# Patient Record
Sex: Female | Born: 1980
Health system: Southern US, Community
[De-identification: ages and names within clinical notes are randomized; demographics above are authoritative.]

## PROBLEM LIST (undated history)

## (undated) DIAGNOSIS — G4730 Sleep apnea, unspecified: Secondary | ICD-10-CM

## (undated) DIAGNOSIS — E781 Pure hyperglyceridemia: Secondary | ICD-10-CM

## (undated) DIAGNOSIS — E669 Obesity, unspecified: Secondary | ICD-10-CM

## (undated) DIAGNOSIS — I10 Essential (primary) hypertension: Secondary | ICD-10-CM

## (undated) DIAGNOSIS — E119 Type 2 diabetes mellitus without complications: Secondary | ICD-10-CM

## (undated) DIAGNOSIS — B029 Zoster without complications: Secondary | ICD-10-CM

## (undated) DIAGNOSIS — D259 Leiomyoma of uterus, unspecified: Secondary | ICD-10-CM

## (undated) HISTORY — DX: Sleep apnea, unspecified: G47.30

## (undated) HISTORY — PX: MYOMECTOMY: SHX85

## (undated) HISTORY — DX: Zoster without complications: B02.9

---

## 2008-10-25 ENCOUNTER — Emergency Department (HOSPITAL_COMMUNITY): Admission: EM | Admit: 2008-10-25 | Discharge: 2008-10-25 | Payer: Self-pay | Admitting: Emergency Medicine

## 2010-02-11 ENCOUNTER — Emergency Department (HOSPITAL_COMMUNITY): Admission: EM | Admit: 2010-02-11 | Discharge: 2010-02-11 | Payer: Self-pay | Admitting: Emergency Medicine

## 2013-09-19 ENCOUNTER — Emergency Department (HOSPITAL_COMMUNITY)
Admission: EM | Admit: 2013-09-19 | Discharge: 2013-09-19 | Disposition: A | Discharge status: Home / Self Care | Payer: PRIVATE HEALTH INSURANCE | Attending: Emergency Medicine | Admitting: Emergency Medicine

## 2013-09-19 ENCOUNTER — Encounter (HOSPITAL_COMMUNITY): Payer: Self-pay | Admitting: Emergency Medicine

## 2013-09-19 ENCOUNTER — Emergency Department (HOSPITAL_COMMUNITY): Payer: PRIVATE HEALTH INSURANCE

## 2013-09-19 DIAGNOSIS — S46919A Strain of unspecified muscle, fascia and tendon at shoulder and upper arm level, unspecified arm, initial encounter: Secondary | ICD-10-CM

## 2013-09-19 DIAGNOSIS — Y92410 Unspecified street and highway as the place of occurrence of the external cause: Secondary | ICD-10-CM | POA: Insufficient documentation

## 2013-09-19 DIAGNOSIS — IMO0002 Reserved for concepts with insufficient information to code with codable children: Secondary | ICD-10-CM | POA: Insufficient documentation

## 2013-09-19 DIAGNOSIS — Y9389 Activity, other specified: Secondary | ICD-10-CM | POA: Insufficient documentation

## 2013-09-19 MED ORDER — DIAZEPAM 5 MG PO TABS: 5.0000 mg | ORAL_TABLET | Freq: Three times a day (TID) | ORAL | Status: DC | PRN

## 2013-09-19 MED ORDER — NAPROXEN 500 MG PO TABS: 500.0000 mg | ORAL_TABLET | Freq: Two times a day (BID) | ORAL | Status: DC

## 2013-09-19 MED ORDER — TRAMADOL HCL 50 MG PO TABS: 50.0000 mg | ORAL_TABLET | Freq: Four times a day (QID) | ORAL | Status: DC | PRN

## 2013-09-19 NOTE — Discharge Instructions (Signed)
Naprosyn for pain and inflammation. Ultram for severe pain. Valiume for spasms. Ice. Follow up with orthopedics specialist if not improving.   Shoulder Sprain A shoulder sprain is the result of damage to the tough, fiber-like tissues (ligaments) that help hold your shoulder in place. The ligaments may be stretched or torn. Besides the main shoulder joint (the ball and socket), there are several smaller joints that connect the bones in this area. A sprain usually involves one of those joints. Most often it is the acromioclavicular (or AC) joint. That is the joint that connects the collarbone (clavicle) and the shoulder blade (scapula) at the top point of the shoulder blade (acromion). A shoulder sprain is a mild form of what is called a shoulder separation. Recovering from a shoulder sprain may take some time. For some, pain lingers for several months. Most people recover without long term problems. CAUSES   A shoulder sprain is usually caused by some kind of trauma. This might be:  Falling on an outstretched arm.  Being hit hard on the shoulder.  Twisting the arm.  Shoulder sprains are more likely to occur in people who:  Play sports.  Have balance or coordination problems. SYMPTOMS   Pain when you move your shoulder.  Limited ability to move the shoulder.  Swelling and tenderness on top of the shoulder.  Redness or warmth in the shoulder.  Bruising.  A change in the shape of the shoulder. DIAGNOSIS  Your healthcare provider may:  Ask about your symptoms.  Ask about recent activity that might have caused those symptoms.  Examine your shoulder. You may be asked to do simple exercises to test movement. The other shoulder will be examined for comparison.  Order some tests that provide a look inside the body. They can show the extent of the injury. The tests could include:  X-rays.  CT (computed tomography) scan.  MRI (magnetic resonance imaging) scan. RISKS AND  COMPLICATIONS  Loss of full shoulder motion.  Ongoing shoulder pain. TREATMENT  How long it takes to recover from a shoulder sprain depends on how severe it was. Treatment options may include:  Rest. You should not use the arm or shoulder until it heals.  Ice. For 2 or 3 days after the injury, put an ice pack on the shoulder up to 4 times a day. It should stay on for 15 to 20 minutes each time. Wrap the ice in a towel so it does not touch your skin.  Over-the-counter medicine to relieve pain.  A sling or brace. This will keep the arm still while the shoulder is healing.  Physical therapy or rehabilitation exercises. These will help you regain strength and motion. Ask your healthcare provider when it is OK to begin these exercises.  Surgery. The need for surgery is rare with a sprained shoulder, but some people may need surgery to keep the joint in place and reduce pain. HOME CARE INSTRUCTIONS   Ask your healthcare provider about what you should and should not do while your shoulder heals.  Make sure you know how to apply ice to the correct area of your shoulder.  Talk with your healthcare provider about which medications should be used for pain and swelling.  If rehabilitation therapy will be needed, ask your healthcare provider to refer you to a therapist. If it is not recommended, then ask about at-home exercises. Find out when exercise should begin. SEEK MEDICAL CARE IF:  Your pain, swelling, or redness at the joint increases.  SEEK IMMEDIATE MEDICAL CARE IF:   You have a fever.  You cannot move your arm or shoulder. Document Released: 12/25/2008 Document Revised: 10/31/2011 Document Reviewed: 12/25/2008 [facility] Patient Information 2014 [place], [place].
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
## 2013-09-19 NOTE — ED Provider Notes (Signed)
Medical screening examination/treatment/procedure(s) were performed by non-physician practitioner and as supervising physician I was immediately available for consultation/collaboration.  EKG Interpretation   None         Neta Ehlers, MD 09/19/13 2206

## 2014-05-20 DIAGNOSIS — E119 Type 2 diabetes mellitus without complications: Secondary | ICD-10-CM | POA: Insufficient documentation

## 2017-02-08 DIAGNOSIS — R1011 Right upper quadrant pain: Secondary | ICD-10-CM | POA: Diagnosis not present

## 2017-02-08 DIAGNOSIS — K625 Hemorrhage of anus and rectum: Secondary | ICD-10-CM | POA: Diagnosis not present

## 2017-02-08 DIAGNOSIS — K640 First degree hemorrhoids: Secondary | ICD-10-CM | POA: Diagnosis not present

## 2017-02-09 ENCOUNTER — Other Ambulatory Visit: Payer: Self-pay | Admitting: Family Medicine

## 2017-02-09 DIAGNOSIS — R1011 Right upper quadrant pain: Secondary | ICD-10-CM

## 2017-03-15 ENCOUNTER — Ambulatory Visit
Admission: RE | Admit: 2017-03-15 | Discharge: 2017-03-15 | Disposition: A | Discharge status: Home / Self Care | Payer: Commercial Managed Care - PPO | Source: Ambulatory Visit | Attending: Family Medicine | Admitting: Family Medicine

## 2017-03-15 DIAGNOSIS — R1011 Right upper quadrant pain: Secondary | ICD-10-CM | POA: Diagnosis not present

## 2017-03-19 DIAGNOSIS — J028 Acute pharyngitis due to other specified organisms: Secondary | ICD-10-CM | POA: Diagnosis not present

## 2017-03-19 DIAGNOSIS — J029 Acute pharyngitis, unspecified: Secondary | ICD-10-CM | POA: Diagnosis not present

## 2017-04-25 DIAGNOSIS — R21 Rash and other nonspecific skin eruption: Secondary | ICD-10-CM | POA: Diagnosis not present

## 2017-04-25 DIAGNOSIS — L659 Nonscarring hair loss, unspecified: Secondary | ICD-10-CM | POA: Diagnosis not present

## 2017-04-25 DIAGNOSIS — R208 Other disturbances of skin sensation: Secondary | ICD-10-CM | POA: Diagnosis not present

## 2017-05-11 DIAGNOSIS — R0600 Dyspnea, unspecified: Secondary | ICD-10-CM | POA: Diagnosis not present

## 2017-05-11 DIAGNOSIS — R208 Other disturbances of skin sensation: Secondary | ICD-10-CM | POA: Diagnosis not present

## 2017-05-11 DIAGNOSIS — Z23 Encounter for immunization: Secondary | ICD-10-CM | POA: Diagnosis not present

## 2017-05-11 DIAGNOSIS — R21 Rash and other nonspecific skin eruption: Secondary | ICD-10-CM | POA: Diagnosis not present

## 2017-05-11 DIAGNOSIS — E119 Type 2 diabetes mellitus without complications: Secondary | ICD-10-CM | POA: Diagnosis not present

## 2017-05-11 DIAGNOSIS — L659 Nonscarring hair loss, unspecified: Secondary | ICD-10-CM | POA: Diagnosis not present

## 2017-05-12 ENCOUNTER — Other Ambulatory Visit: Payer: Self-pay | Admitting: Family Medicine

## 2017-05-12 DIAGNOSIS — R222 Localized swelling, mass and lump, trunk: Secondary | ICD-10-CM

## 2017-07-19 DIAGNOSIS — M940 Chondrocostal junction syndrome [Tietze]: Secondary | ICD-10-CM | POA: Diagnosis not present

## 2017-07-19 DIAGNOSIS — R1011 Right upper quadrant pain: Secondary | ICD-10-CM | POA: Diagnosis not present

## 2017-07-19 DIAGNOSIS — R1031 Right lower quadrant pain: Secondary | ICD-10-CM | POA: Diagnosis not present

## 2017-08-11 DIAGNOSIS — K760 Fatty (change of) liver, not elsewhere classified: Secondary | ICD-10-CM | POA: Diagnosis not present

## 2017-08-11 DIAGNOSIS — E559 Vitamin D deficiency, unspecified: Secondary | ICD-10-CM | POA: Diagnosis not present

## 2017-08-11 DIAGNOSIS — E119 Type 2 diabetes mellitus without complications: Secondary | ICD-10-CM | POA: Diagnosis not present

## 2017-08-11 DIAGNOSIS — R109 Unspecified abdominal pain: Secondary | ICD-10-CM | POA: Diagnosis not present

## 2017-08-18 ENCOUNTER — Ambulatory Visit
Admission: RE | Admit: 2017-08-18 | Discharge: 2017-08-18 | Disposition: A | Discharge status: Home / Self Care | Payer: Commercial Managed Care - PPO | Source: Ambulatory Visit | Attending: Family Medicine | Admitting: Family Medicine

## 2017-08-18 DIAGNOSIS — R911 Solitary pulmonary nodule: Secondary | ICD-10-CM | POA: Diagnosis not present

## 2017-08-18 DIAGNOSIS — R222 Localized swelling, mass and lump, trunk: Secondary | ICD-10-CM

## 2017-08-18 MED ORDER — IOPAMIDOL (ISOVUE-300) INJECTION 61%
75.0000 mL | Freq: Once | INTRAVENOUS | Status: AC | PRN
  Administered 2017-08-18: 75 mL via INTRAVENOUS

## 2017-08-24 DIAGNOSIS — Z808 Family history of malignant neoplasm of other organs or systems: Secondary | ICD-10-CM | POA: Diagnosis not present

## 2017-08-24 DIAGNOSIS — Z01419 Encounter for gynecological examination (general) (routine) without abnormal findings: Secondary | ICD-10-CM | POA: Diagnosis not present

## 2017-08-24 DIAGNOSIS — Z8041 Family history of malignant neoplasm of ovary: Secondary | ICD-10-CM | POA: Diagnosis not present

## 2017-08-24 DIAGNOSIS — Z803 Family history of malignant neoplasm of breast: Secondary | ICD-10-CM | POA: Diagnosis not present

## 2017-08-29 ENCOUNTER — Other Ambulatory Visit: Payer: Self-pay | Admitting: Obstetrics & Gynecology

## 2017-08-29 DIAGNOSIS — N644 Mastodynia: Secondary | ICD-10-CM

## 2017-09-08 ENCOUNTER — Ambulatory Visit
Admission: RE | Admit: 2017-09-08 | Discharge: 2017-09-08 | Disposition: A | Discharge status: Home / Self Care | Payer: Commercial Managed Care - PPO | Source: Ambulatory Visit | Attending: Obstetrics & Gynecology | Admitting: Obstetrics & Gynecology

## 2017-09-08 ENCOUNTER — Other Ambulatory Visit: Payer: Self-pay | Admitting: Obstetrics & Gynecology

## 2017-09-08 DIAGNOSIS — N644 Mastodynia: Secondary | ICD-10-CM

## 2017-09-08 DIAGNOSIS — N651 Disproportion of reconstructed breast: Secondary | ICD-10-CM | POA: Diagnosis not present

## 2017-09-08 DIAGNOSIS — R928 Other abnormal and inconclusive findings on diagnostic imaging of breast: Secondary | ICD-10-CM | POA: Diagnosis not present

## 2017-09-08 DIAGNOSIS — N6320 Unspecified lump in the left breast, unspecified quadrant: Secondary | ICD-10-CM

## 2017-09-15 ENCOUNTER — Other Ambulatory Visit: Payer: Self-pay | Admitting: Obstetrics & Gynecology

## 2017-09-15 ENCOUNTER — Ambulatory Visit
Admission: RE | Admit: 2017-09-15 | Discharge: 2017-09-15 | Disposition: A | Discharge status: Home / Self Care | Payer: Commercial Managed Care - PPO | Source: Ambulatory Visit | Attending: Obstetrics & Gynecology | Admitting: Obstetrics & Gynecology

## 2017-09-15 DIAGNOSIS — N6320 Unspecified lump in the left breast, unspecified quadrant: Secondary | ICD-10-CM

## 2017-09-15 DIAGNOSIS — N6489 Other specified disorders of breast: Secondary | ICD-10-CM

## 2017-12-19 DIAGNOSIS — K760 Fatty (change of) liver, not elsewhere classified: Secondary | ICD-10-CM | POA: Diagnosis not present

## 2017-12-19 DIAGNOSIS — E559 Vitamin D deficiency, unspecified: Secondary | ICD-10-CM | POA: Diagnosis not present

## 2017-12-19 DIAGNOSIS — E119 Type 2 diabetes mellitus without complications: Secondary | ICD-10-CM | POA: Diagnosis not present

## 2018-03-16 ENCOUNTER — Ambulatory Visit: Payer: Commercial Managed Care - PPO

## 2018-03-16 ENCOUNTER — Other Ambulatory Visit: Payer: Self-pay | Admitting: Obstetrics & Gynecology

## 2018-03-16 ENCOUNTER — Ambulatory Visit
Admission: RE | Admit: 2018-03-16 | Discharge: 2018-03-16 | Disposition: A | Discharge status: Home / Self Care | Payer: Commercial Managed Care - PPO | Source: Ambulatory Visit | Attending: Obstetrics & Gynecology | Admitting: Obstetrics & Gynecology

## 2018-03-16 DIAGNOSIS — N6489 Other specified disorders of breast: Secondary | ICD-10-CM

## 2018-03-16 DIAGNOSIS — R928 Other abnormal and inconclusive findings on diagnostic imaging of breast: Secondary | ICD-10-CM | POA: Diagnosis not present

## 2018-06-18 DIAGNOSIS — K760 Fatty (change of) liver, not elsewhere classified: Secondary | ICD-10-CM | POA: Diagnosis not present

## 2018-06-18 DIAGNOSIS — Z23 Encounter for immunization: Secondary | ICD-10-CM | POA: Diagnosis not present

## 2018-06-18 DIAGNOSIS — E559 Vitamin D deficiency, unspecified: Secondary | ICD-10-CM | POA: Diagnosis not present

## 2018-06-18 DIAGNOSIS — E119 Type 2 diabetes mellitus without complications: Secondary | ICD-10-CM | POA: Diagnosis not present

## 2018-10-17 DIAGNOSIS — Z111 Encounter for screening for respiratory tuberculosis: Secondary | ICD-10-CM | POA: Diagnosis not present

## 2018-12-18 DIAGNOSIS — K5901 Slow transit constipation: Secondary | ICD-10-CM | POA: Diagnosis not present

## 2018-12-18 DIAGNOSIS — E119 Type 2 diabetes mellitus without complications: Secondary | ICD-10-CM | POA: Diagnosis not present

## 2018-12-18 DIAGNOSIS — K760 Fatty (change of) liver, not elsewhere classified: Secondary | ICD-10-CM | POA: Diagnosis not present

## 2018-12-20 DIAGNOSIS — K760 Fatty (change of) liver, not elsewhere classified: Secondary | ICD-10-CM | POA: Diagnosis not present

## 2018-12-20 DIAGNOSIS — E119 Type 2 diabetes mellitus without complications: Secondary | ICD-10-CM | POA: Diagnosis not present

## 2018-12-20 DIAGNOSIS — R142 Eructation: Secondary | ICD-10-CM | POA: Diagnosis not present

## 2018-12-20 DIAGNOSIS — E559 Vitamin D deficiency, unspecified: Secondary | ICD-10-CM | POA: Diagnosis not present

## 2018-12-20 DIAGNOSIS — I10 Essential (primary) hypertension: Secondary | ICD-10-CM | POA: Diagnosis not present

## 2019-05-24 ENCOUNTER — Emergency Department (HOSPITAL_COMMUNITY): Payer: Commercial Managed Care - PPO

## 2019-05-24 ENCOUNTER — Other Ambulatory Visit: Payer: Self-pay

## 2019-05-24 ENCOUNTER — Encounter (HOSPITAL_COMMUNITY): Payer: Self-pay

## 2019-05-24 ENCOUNTER — Emergency Department (HOSPITAL_COMMUNITY)
Admission: EM | Admit: 2019-05-24 | Discharge: 2019-05-24 | Disposition: A | Discharge status: Home / Self Care | Payer: Commercial Managed Care - PPO | Attending: Emergency Medicine | Admitting: Emergency Medicine

## 2019-05-24 DIAGNOSIS — Z20822 Contact with and (suspected) exposure to covid-19: Secondary | ICD-10-CM

## 2019-05-24 DIAGNOSIS — R319 Hematuria, unspecified: Secondary | ICD-10-CM | POA: Insufficient documentation

## 2019-05-24 DIAGNOSIS — I10 Essential (primary) hypertension: Secondary | ICD-10-CM | POA: Diagnosis not present

## 2019-05-24 DIAGNOSIS — R509 Fever, unspecified: Secondary | ICD-10-CM | POA: Diagnosis present

## 2019-05-24 DIAGNOSIS — Z20828 Contact with and (suspected) exposure to other viral communicable diseases: Secondary | ICD-10-CM | POA: Insufficient documentation

## 2019-05-24 DIAGNOSIS — E119 Type 2 diabetes mellitus without complications: Secondary | ICD-10-CM | POA: Diagnosis not present

## 2019-05-24 DIAGNOSIS — N390 Urinary tract infection, site not specified: Secondary | ICD-10-CM | POA: Diagnosis not present

## 2019-05-24 HISTORY — DX: Pure hyperglyceridemia: E78.1

## 2019-05-24 HISTORY — DX: Type 2 diabetes mellitus without complications: E11.9

## 2019-05-24 HISTORY — DX: Essential (primary) hypertension: I10

## 2019-05-24 LAB — URINALYSIS, ROUTINE W REFLEX MICROSCOPIC
Bilirubin Urine: NEGATIVE
Glucose, UA: NEGATIVE mg/dL
Ketones, ur: NEGATIVE mg/dL
Nitrite: NEGATIVE
Protein, ur: 30 mg/dL — AB
RBC / HPF: >50 RBC/hpf — ABNORMAL HIGH (ref 0–5)
Specific Gravity, Urine: 1.02 (ref 1.005–1.030)
pH: 7 (ref 5.0–8.0)

## 2019-05-24 LAB — COMPREHENSIVE METABOLIC PANEL
ALT: 13 U/L (ref 0–44)
AST: 20 U/L (ref 15–41)
Albumin: 3.9 g/dL (ref 3.5–5.0)
Alkaline Phosphatase: 54 U/L (ref 38–126)
Anion gap: 10 (ref 5–15)
BUN: 15 mg/dL (ref 6–20)
CO2: 20 mmol/L — ABNORMAL LOW (ref 22–32)
Calcium: 8.8 mg/dL — ABNORMAL LOW (ref 8.9–10.3)
Chloride: 103 mmol/L (ref 98–111)
Creatinine, Ser: 0.73 mg/dL (ref 0.44–1.00)
GFR calc Af Amer: >60 mL/min (ref >60)
GFR calc non Af Amer: >60 mL/min (ref >60)
Glucose, Bld: 200 mg/dL — ABNORMAL HIGH (ref 70–99)
Potassium: 4.2 mmol/L (ref 3.5–5.1)
Sodium: 133 mmol/L — ABNORMAL LOW (ref 135–145)
Total Bilirubin: 1.1 mg/dL (ref 0.3–1.2)
Total Protein: 7.1 g/dL (ref 6.5–8.1)

## 2019-05-24 LAB — CBC WITH DIFFERENTIAL/PLATELET
Abs Immature Granulocytes: 0.05 10*3/uL (ref 0.00–0.07)
Basophils Absolute: 0 10*3/uL (ref 0.0–0.1)
Basophils Relative: 0 %
Eosinophils Absolute: 0 10*3/uL (ref 0.0–0.5)
Eosinophils Relative: 0 %
HCT: 38 % (ref 36.0–46.0)
Hemoglobin: 11.5 g/dL — ABNORMAL LOW (ref 12.0–15.0)
Immature Granulocytes: 0 %
Lymphocytes Relative: 5 %
Lymphs Abs: 0.7 10*3/uL (ref 0.7–4.0)
MCH: 28 pg (ref 26.0–34.0)
MCHC: 30.3 g/dL (ref 30.0–36.0)
MCV: 92.5 fL (ref 80.0–100.0)
Monocytes Absolute: 0.4 10*3/uL (ref 0.1–1.0)
Monocytes Relative: 3 %
Neutro Abs: 11.4 10*3/uL — ABNORMAL HIGH (ref 1.7–7.7)
Neutrophils Relative %: 92 %
Platelets: 228 10*3/uL (ref 150–400)
RBC: 4.11 MIL/uL (ref 3.87–5.11)
RDW: 14.8 % (ref 11.5–15.5)
WBC: 12.5 10*3/uL — ABNORMAL HIGH (ref 4.0–10.5)
nRBC: 0 % (ref 0.0–0.2)

## 2019-05-24 LAB — PREGNANCY, URINE: Preg Test, Ur: NEGATIVE

## 2019-05-24 MED ORDER — CEPHALEXIN 500 MG PO CAPS
500.0000 mg | ORAL_CAPSULE | Freq: Once | ORAL | Status: AC
  Administered 2019-05-24: 500 mg via ORAL
  Filled 2019-05-24: qty 1

## 2019-05-24 MED ORDER — CEPHALEXIN 500 MG PO CAPS: 500.0000 mg | ORAL_CAPSULE | Freq: Four times a day (QID) | ORAL | 0 refills | Status: AC

## 2019-05-24 MED ORDER — ACETAMINOPHEN 325 MG PO TABS
650.0000 mg | ORAL_TABLET | Freq: Once | ORAL | Status: AC | PRN
  Administered 2019-05-24: 650 mg via ORAL
  Filled 2019-05-24: qty 2

## 2019-05-24 NOTE — ED Triage Notes (Addendum)
Patient c/o chills and feeling "shakey" which started this AM. Patient denies any fever, but had T. 101.2 in triage.

## 2019-05-24 NOTE — ED Notes (Signed)
Patient give cups of water, per PA request.

## 2019-05-24 NOTE — Discharge Instructions (Signed)
You were given a prescription for antibiotics to treat a potential urinary tract infection. Please take the antibiotic prescription fully.   Today you were were tested for the coronavirus.  The results will be available in the next 3 to 5 days.  If the results are positive the hospital will contact you.  If they are negative the hospital would not contact you.  You will need to self quarantine until you are aware of your results.  If they are positive you will need to self quarantine as directed below.  You should be isolated for at least 7 days since the onset of your symptoms AND >72 hours after symptoms resolution (absence of fever without the use of fever reducing medication and improvement in respiratory symptoms), whichever is longer  Please follow up with your primary care provider within 5-7 days for re-evaluation of your symptoms. If you do not have a primary care provider, information for a healthcare clinic has been provided for you to make arrangements for follow up care. Please return to the emergency department for any new or worsening symptoms.

## 2019-05-24 NOTE — ED Provider Notes (Signed)
[facility] DEPT Provider Note   CSN: [id number] Arrival date & time: 05/24/19  1058     History   Chief Complaint Chief Complaint  Patient presents with  . Chills    HPI Rachel [name] is a 38 y.o. female.     HPI   Pt is a 38 y/o female with a h/o DM, HLD, HTN who presents to the ED today c/o chills and fatigue that began when she woke up this morning. She began to have a pressure HA consistent with a sinus headache.  Denies rhinorrhea, congestion, sore throat, cough, NVD, abd pain, dysuria, frequency, urgency, abnormal vaginal discharge. Denies neck stiffness or neck pain.   She is on her menses.  She denies any known COVID contacts or sick contacts.   Past Medical History:  Diagnosis Date  . Diabetes mellitus without complication ([place])    pre diabetes  . High triglycerides   . Hypertension     There are no active problems to display for this patient.   History reviewed. No pertinent surgical history.   OB History   No obstetric history on file.      Home Medications    Prior to Admission medications   Medication Sig Start Date End Date Taking? Authorizing Provider  cephALEXin (KEFLEX) 500 MG capsule Take 1 capsule (500 mg total) by mouth 4 (four) times daily for 10 days. 05/24/19 06/03/19  Alok Minshall S, PA-C  diazepam (VALIUM) 5 MG tablet Take 1 tablet (5 mg total) by mouth every 8 (eight) hours as needed for anxiety or muscle spasms. 09/19/13   Kirichenko, Tatyana, PA-C  naproxen (NAPROSYN) 500 MG tablet Take 1 tablet (500 mg total) by mouth 2 (two) times daily. 09/19/13   Kirichenko, Lahoma Rocker, PA-C  traMADol (ULTRAM) 50 MG tablet Take 1 tablet (50 mg total) by mouth every 6 (six) hours as needed. 09/19/13   Jeannett Senior, PA-C    Family History Family History  Problem Relation Age of Onset  . Breast cancer Maternal Aunt     Social History Social History   Tobacco Use  . Smoking status: Never Smoker  .  Smokeless tobacco: Never Used  Substance Use Topics  . Alcohol use: No  . Drug use: No     Allergies   Patient has no known allergies.   Review of Systems Review of Systems  Constitutional: Positive for chills.  HENT: Negative for congestion, rhinorrhea and sore throat.   Eyes: Negative for visual disturbance.  Respiratory: Negative for shortness of breath.   Cardiovascular: Negative for chest pain.  Gastrointestinal: Negative for abdominal pain, constipation, diarrhea, nausea and vomiting.  Genitourinary: Negative for dysuria, frequency, pelvic pain and urgency.  Musculoskeletal: Negative for back pain.  Skin: Negative for color change and wound.  Neurological: Positive for headaches. Negative for dizziness, weakness, light-headedness and numbness.   Physical Exam Updated Vital Signs BP 137/89 (BP Location: Left Arm)   Pulse 99   Temp 98.8 F (37.1 C) (Oral)   Resp 19   Ht 5\' 5"  (1.651 m)   Wt 120.7 kg   LMP 05/24/2019   SpO2 100%   BMI 44.26 kg/m   Physical Exam Vitals signs and nursing note reviewed.  Constitutional:      General: She is not in acute distress.    Appearance: She is well-developed.  HENT:     Head: Normocephalic and atraumatic.  Eyes:     Conjunctiva/sclera: Conjunctivae normal.  Neck:  Musculoskeletal: Neck supple.  Cardiovascular:     Rate and Rhythm: Regular rhythm. Tachycardia present.     Heart sounds: No murmur.  Pulmonary:     Effort: Pulmonary effort is normal. No respiratory distress.     Breath sounds: Normal breath sounds. No wheezing, rhonchi or rales.  Abdominal:     General: Bowel sounds are normal.     Palpations: Abdomen is soft.     Tenderness: There is no abdominal tenderness. There is no right CVA tenderness, left CVA tenderness, guarding or rebound.  Skin:    General: Skin is warm and dry.  Neurological:     Mental Status: She is alert.      ED Treatments / Results  Labs (all labs ordered are listed, but  only abnormal results are displayed) Labs Reviewed  CBC WITH DIFFERENTIAL/PLATELET - Abnormal; Notable for the following components:      Result Value   WBC 12.5 (*)    Hemoglobin 11.5 (*)    Neutro Abs 11.4 (*)    All other components within normal limits  COMPREHENSIVE METABOLIC PANEL - Abnormal; Notable for the following components:   Sodium 133 (*)    CO2 20 (*)    Glucose, Bld 200 (*)    Calcium 8.8 (*)    All other components within normal limits  URINALYSIS, ROUTINE W REFLEX MICROSCOPIC - Abnormal; Notable for the following components:   APPearance HAZY (*)    Hgb urine dipstick LARGE (*)    Protein, ur 30 (*)    Leukocytes,Ua TRACE (*)    RBC / HPF >50 (*)    Bacteria, UA RARE (*)    All other components within normal limits  URINE CULTURE  NOVEL CORONAVIRUS, NAA (HOSP ORDER, SEND-OUT TO REF LAB; TAT 18-24 HRS)  PREGNANCY, URINE  I-STAT BETA HCG BLOOD, ED ([facility], [facility], [facility] ONLY)    EKG None  Radiology Dg Chest Portable 1 View  Result Date: 05/24/2019 CLINICAL DATA:  Fever EXAM: PORTABLE CHEST 1 VIEW COMPARISON:  None. FINDINGS: Lungs are clear. Heart size and pulmonary vascularity are normal. No adenopathy. No bone lesions. IMPRESSION: No edema or consolidation. Electronically Signed   By: Lowella Grip III M.D.   On: 05/24/2019 13:02    Procedures Procedures (including critical care time)  Medications Ordered in ED Medications  acetaminophen (TYLENOL) tablet 650 mg (650 mg Oral Given 05/24/19 1202)  cephALEXin (KEFLEX) capsule 500 mg (500 mg Oral Given 05/24/19 1422)     Initial Impression / Assessment and Plan / ED Course  I have reviewed the triage vital signs and the nursing notes.  Pertinent labs & imaging results that were available during my care of the patient were reviewed by me and considered in my medical decision making (see chart for details).     Final Clinical Impressions(s) / ED Diagnoses   Final diagnoses:  Fever, unspecified fever cause   Urinary tract infection with hematuria, site unspecified  Suspected COVID-19 virus infection   38 y/o female presenting with chills and temors this AM. On arrival to the ED found to be febrile and tachycardic. VS are otherwise WNL. She is c/o a pressure headache but no associated neuro sxs. She denies upper resp sxs, GI, GU, or any skin/soft tissue infections.   She has mild leukocytosis, mild anemia cmp is nonacute u preg negative covid test pending ua with hematuria, trace leuks, >50 rbc, 6-10 wbc, and rare bacteria.  - pt on menses. She is asymptomatic but  given her fever and no source of infection with tx with keflex for potential pyelonephritis.   cxr negative  Pt advised to take meds as directed, take tylenol/motrin and stay well hydrated. Advised on quarantine measures given there is also increased suspicion for COVID. Advised to RTER for new or worsening symptoms. She voices understanding and is in agreement with plan, all questions answered, pt stable for d.c   ---   Rachel [name] was evaluated in Emergency Department on 05/24/2019 for the symptoms described in the history of present illness. She was evaluated in the context of the global COVID-19 pandemic, which necessitated consideration that the patient might be at risk for infection with the SARS-CoV-2 virus that causes COVID-19. Institutional protocols and algorithms that pertain to the evaluation of patients at risk for COVID-19 are in a state of rapid change based on information released by regulatory bodies including the CDC and federal and state organizations. These policies and algorithms were followed during the patient's care in the ED.   ED Discharge Orders         Ordered    cephALEXin (KEFLEX) 500 MG capsule  4 times daily     05/24/19 [address] [place], PA-C 05/24/19 1740    Charlesetta Shanks, MD 05/27/19 0725

## 2019-05-25 LAB — URINE CULTURE: Culture: 30000 — AB

## 2019-05-25 LAB — NOVEL CORONAVIRUS, NAA (HOSP ORDER, SEND-OUT TO REF LAB; TAT 18-24 HRS): SARS-CoV-2, NAA: NOT DETECTED

## 2019-06-01 ENCOUNTER — Encounter (HOSPITAL_COMMUNITY): Payer: Self-pay | Admitting: *Deleted

## 2019-06-01 ENCOUNTER — Emergency Department (HOSPITAL_COMMUNITY)
Admission: EM | Admit: 2019-06-01 | Discharge: 2019-06-01 | Disposition: A | Discharge status: Home / Self Care | Payer: Commercial Managed Care - PPO | Attending: Emergency Medicine | Admitting: Emergency Medicine

## 2019-06-01 ENCOUNTER — Other Ambulatory Visit: Payer: Self-pay

## 2019-06-01 DIAGNOSIS — H66002 Acute suppurative otitis media without spontaneous rupture of ear drum, left ear: Secondary | ICD-10-CM | POA: Diagnosis not present

## 2019-06-01 DIAGNOSIS — Z7984 Long term (current) use of oral hypoglycemic drugs: Secondary | ICD-10-CM | POA: Insufficient documentation

## 2019-06-01 DIAGNOSIS — Z79899 Other long term (current) drug therapy: Secondary | ICD-10-CM | POA: Diagnosis not present

## 2019-06-01 DIAGNOSIS — H9202 Otalgia, left ear: Secondary | ICD-10-CM | POA: Diagnosis present

## 2019-06-01 DIAGNOSIS — E119 Type 2 diabetes mellitus without complications: Secondary | ICD-10-CM | POA: Insufficient documentation

## 2019-06-01 DIAGNOSIS — I10 Essential (primary) hypertension: Secondary | ICD-10-CM | POA: Diagnosis not present

## 2019-06-01 MED ORDER — AMOXICILLIN 500 MG PO CAPS: 500.0000 mg | ORAL_CAPSULE | Freq: Three times a day (TID) | ORAL | 0 refills | Status: DC

## 2019-06-01 MED ORDER — AMOXICILLIN 500 MG PO CAPS
500.0000 mg | ORAL_CAPSULE | Freq: Once | ORAL | Status: AC
  Administered 2019-06-01: 500 mg via ORAL
  Filled 2019-06-01: qty 1

## 2019-06-01 NOTE — ED Provider Notes (Signed)
[facility] DEPT Provider Note   CSN: [id number] Arrival date & time: 06/01/19  0052     History   Chief Complaint Chief Complaint  Patient presents with   Otalgia    HPI Rachel [name] is a 38 y.o. female with a past medical history significant for diabetes, hyperlipidemia, and hypertension who presents to the ED due to severe, left-sided ear pain for the past 4 days.  Patient states she was recently seen in the ED on 10/2 and was found to have a UTI and was placed on Keflex. While patient was at the hospital she was given a COVID test and patient feels that the COVID test was pushed too far up her nose and "punctured the lining in her nose" which she believes is causing all of her issues. Patient started experiencing left-sided headache on Sunday which graduated moved to her left ear over the past few days. Patient was seen by PCP this morning for same complaint, but was not given any treatment due to her current UTI antibiotics. Patient notes she is experiencing 10/10, left ear pain that radiates to the jaw and back of the neck. Ear pain is associated with postnasal drip, tinnitus, rhinorrhea, and inability to fully open mouth due to pain. Patient is able to tolerate her oral secretions. Patient denies fever, rigid neck, hearing loss, chest pain, and shortness of breath.   Past Medical History:  Diagnosis Date   Diabetes mellitus without complication ([place])    pre diabetes   High triglycerides    Hypertension     There are no active problems to display for this patient.   History reviewed. No pertinent surgical history.   OB History   No obstetric history on file.      Home Medications    Prior to Admission medications   Medication Sig Start Date End Date Taking? Authorizing Provider  acetaminophen (TYLENOL) 325 MG tablet Take 650 mg by mouth every 6 (six) hours as needed for moderate pain.   Yes [provider]  Ascorbic Acid  (VITAMIN C) 1000 MG tablet Take 1,000 mg by mouth daily.   Yes [provider]  cephALEXin (KEFLEX) 500 MG capsule Take 1 capsule (500 mg total) by mouth 4 (four) times daily for 10 days. 05/24/19 06/03/19 Yes Couture, Cortni S, PA-C  cholecalciferol (VITAMIN D3) 25 MCG (1000 UT) tablet Take 1,000 Units by mouth daily.   Yes [provider]  glipiZIDE (GLUCOTROL) 5 MG tablet Take 5 mg by mouth 2 (two) times daily. 04/27/19  Yes [provider]  lisinopril (ZESTRIL) 10 MG tablet Take 10 mg by mouth daily. 04/27/19  Yes [provider]  metFORMIN (GLUCOPHAGE) 500 MG tablet Take 500-1,000 mg by mouth See admin instructions. 1000mg  in the am and 500 mg in the pm 05/05/14  Yes [provider]  Multiple Vitamins-Minerals (ZINC PO) Take 1 tablet by mouth daily.   Yes [provider]  omeprazole (PRILOSEC) 20 MG capsule Take 20 mg by mouth daily.  05/24/19  Yes [provider]  traMADol (ULTRAM) 50 MG tablet Take 50 mg by mouth every 6 (six) hours as needed for moderate pain.   Yes [provider]  amoxicillin (AMOXIL) 500 MG capsule Take 1 capsule (500 mg total) by mouth 3 (three) times daily. 06/01/19   Cheek, Comer Locket, PA-C  diazepam (VALIUM) 5 MG tablet Take 1 tablet (5 mg total) by mouth every 8 (eight) hours as needed for anxiety or  muscle spasms. Patient not taking: Reported on 06/01/2019 09/19/13   Jeannett Senior, PA-C  naproxen (NAPROSYN) 500 MG tablet Take 1 tablet (500 mg total) by mouth 2 (two) times daily. Patient not taking: Reported on 06/01/2019 09/19/13   Jeannett Senior, PA-C  traMADol (ULTRAM) 50 MG tablet Take 1 tablet (50 mg total) by mouth every 6 (six) hours as needed. Patient not taking: Reported on 06/01/2019 09/19/13   Jeannett Senior, PA-C    Family History Family History  Problem Relation Age of Onset   Breast cancer Maternal Aunt     Social History Social History   Tobacco Use   Smoking  status: Never Smoker   Smokeless tobacco: Never Used  Substance Use Topics   Alcohol use: No   Drug use: No     Allergies   Patient has no known allergies.   Review of Systems Review of Systems  Constitutional: Negative for chills and fever.  HENT: Positive for congestion, ear discharge, ear pain, postnasal drip, rhinorrhea, sinus pressure and tinnitus. Negative for dental problem, drooling, hearing loss, sore throat, trouble swallowing and voice change.   Eyes: Negative for pain and discharge.  Respiratory: Negative for cough and shortness of breath.   Cardiovascular: Negative for chest pain and palpitations.  Gastrointestinal: Negative for abdominal pain, diarrhea and nausea.  Genitourinary: Negative for dysuria and flank pain.  All other systems reviewed and are negative.    Physical Exam Updated Vital Signs BP (!) 150/90 (BP Location: Left Arm) Comment: Pt states hx of HTN, on meds   Pulse 90    Temp 98.6 F (37 C) (Oral)    Resp 16    Ht 5\' 5"  (1.651 m)    Wt 122.5 kg    LMP 05/24/2019    SpO2 98%    BMI 44.93 kg/m   Physical Exam Constitutional:      General: She is not in acute distress. HENT:     Head: Normocephalic.     Right Ear: Tympanic membrane, ear canal and external ear normal.     Ears:     Comments: Left Ear: bulging tympanic membrane with erythema. Does not appear ruptured, TTP around pinna, nondisplaced ear, TTP behind ear. Right Ear: normal right ear    Nose: Congestion present.     Comments: Edematous and erythematous turbinates bilaterally. TTP over left front and left maxillary sinuses.    Mouth/Throat:     Mouth: Mucous membranes are moist.     Comments: Patient unable to open mouth fully due to pain. Mild erythema noted. No exudates. Uvula midline.  Eyes:     Conjunctiva/sclera: Conjunctivae normal.     Pupils: Pupils are equal, round, and reactive to light.  Neck:     Musculoskeletal: Normal range of motion and neck supple. Muscular  tenderness (anterior left sided tenderness to palpation.) present. No neck rigidity.  Cardiovascular:     Rate and Rhythm: Normal rate and regular rhythm.     Pulses: Normal pulses.     Heart sounds: Normal heart sounds. No murmur. No friction rub. No gallop.   Pulmonary:     Effort: Pulmonary effort is normal.     Breath sounds: Normal breath sounds.  Abdominal:     General: Abdomen is flat. Bowel sounds are normal. There is no distension.     Palpations: Abdomen is soft.     Tenderness: There is no abdominal tenderness.  Lymphadenopathy:     Cervical: Cervical adenopathy present.  Skin:  General: Skin is warm and dry.  Neurological:     General: No focal deficit present.      ED Treatments / Results  Labs (all labs ordered are listed, but only abnormal results are displayed) Labs Reviewed - No data to display  EKG None  Radiology No results found.  Procedures Procedures (including critical care time)  Medications Ordered in ED Medications  amoxicillin (AMOXIL) capsule 500 mg (500 mg Oral Given 06/01/19 0446)     Initial Impression / Assessment and Plan / ED Course  I have reviewed the triage vital signs and the nursing notes.  Pertinent labs & imaging results that were available during my care of the patient were reviewed by me and considered in my medical decision making (see chart for details).  Kaeya Powe is a 38 year old female who presents to the ED for an evaluation of left ear pain.  Patient was seen earlier today by her PCP who was concerned about postnasal drainage and believed it could be caused by a punctured sinus due to the COVID swab.  Upon arrival, patient was afebrile, not hypoxic, with a normal heart rate. Patient was found to have mildly elevated blood pressure.  On physical exam, left tympanic membrane was bulging, did not appear perforated.  Patient had significant tenderness around her left ear. Exam consistent with acute otitis media. Spoke  to Dr. Roxanne Mins about post-auricular tenderness and if a CT is needed. We discussed the case and decided given that she is not overly tender over the mastoid bone and is afebrile, CT is not warranted at this time. No concern for meningitis. Hannah Muthersbaugh, PA-C performed a nasal speculum exam to ensure no damage was done during the COVID test a few days prior. No purulent drainage or signs of damage noted on exam. Given that patient was negative for an epistaxis after the COVID swab do not feel any puncture took place. Discussed with patient that even if there was a puncture to her sinus, we would still treat her otitis media the same way with close ENT follow-up if no improved. Education was given to patient that COVID tests cannot puncture the brain due to bone in between.  Patient is currently on Keflex for UTI. Patient discharged home with Amoxicillin. Advised patient to follow-up with ENT if pain does not improve in the next few days. Strict ED return precautions discussed with patient. Patient states understanding and agrees to plan. Patient also advised to follow-up with PCP in a week to recheck blood pressure. Patient discharged home in stable condition.   Final Clinical Impressions(s) / ED Diagnoses   Final diagnoses:  Non-recurrent acute suppurative otitis media of left ear without spontaneous rupture of tympanic membrane    ED Discharge Orders         Ordered    amoxicillin (AMOXIL) 500 MG capsule  3 times daily     06/01/19 0433           Romie Levee [id number]    [place] Fuel, [place] [id number] [phone]

## 2019-06-01 NOTE — Discharge Instructions (Addendum)
I have given you a prescription for an antibiotic for your ear infection. Please take as directed. Finish all of your antibiotics. I have also given you a number for an ENT in the area for follow-up if your symptoms do not improve. Still continue taking the antibiotics you were given for your UTI. Return to the ED if you have worsening symptoms, develop a fever, unable to open mouth.

## 2019-06-01 NOTE — ED Triage Notes (Signed)
Reports left ear pain since around Tuesday. Stating feels like she has a drip in her throat. No fevers. Took tylenol for pain, went to her PCP clinic and she was prescribed tramadol- last took around 1900.

## 2019-06-27 ENCOUNTER — Ambulatory Visit (INDEPENDENT_AMBULATORY_CARE_PROVIDER_SITE_OTHER): Payer: Commercial Managed Care - PPO | Admitting: Otolaryngology

## 2019-06-27 DIAGNOSIS — J310 Chronic rhinitis: Secondary | ICD-10-CM | POA: Diagnosis not present

## 2019-06-27 DIAGNOSIS — J342 Deviated nasal septum: Secondary | ICD-10-CM

## 2019-06-27 DIAGNOSIS — J343 Hypertrophy of nasal turbinates: Secondary | ICD-10-CM | POA: Diagnosis not present

## 2019-07-02 ENCOUNTER — Other Ambulatory Visit: Payer: Self-pay | Admitting: Otolaryngology

## 2019-07-02 DIAGNOSIS — R519 Headache, unspecified: Secondary | ICD-10-CM

## 2019-07-02 DIAGNOSIS — J329 Chronic sinusitis, unspecified: Secondary | ICD-10-CM

## 2019-07-09 ENCOUNTER — Other Ambulatory Visit: Payer: Self-pay | Admitting: Otolaryngology

## 2019-07-11 ENCOUNTER — Other Ambulatory Visit: Payer: Self-pay

## 2019-07-11 ENCOUNTER — Ambulatory Visit
Admission: RE | Admit: 2019-07-11 | Discharge: 2019-07-11 | Disposition: A | Discharge status: Home / Self Care | Payer: Commercial Managed Care - PPO | Source: Ambulatory Visit | Attending: Otolaryngology | Admitting: Otolaryngology

## 2019-07-11 DIAGNOSIS — R519 Headache, unspecified: Secondary | ICD-10-CM

## 2019-07-11 DIAGNOSIS — J329 Chronic sinusitis, unspecified: Secondary | ICD-10-CM

## 2019-08-01 DIAGNOSIS — J343 Hypertrophy of nasal turbinates: Secondary | ICD-10-CM | POA: Insufficient documentation

## 2019-08-01 DIAGNOSIS — J342 Deviated nasal septum: Secondary | ICD-10-CM | POA: Insufficient documentation

## 2019-09-03 ENCOUNTER — Other Ambulatory Visit: Payer: Self-pay

## 2019-09-03 ENCOUNTER — Ambulatory Visit: Payer: Managed Care, Other (non HMO) | Admitting: Neurology

## 2019-09-03 ENCOUNTER — Encounter: Payer: Self-pay | Admitting: Neurology

## 2019-09-03 VITALS — BP 123/78 | HR 93 | Temp 97.9°F | Ht 65.0 in | Wt 265.0 lb

## 2019-09-03 DIAGNOSIS — Z6841 Body Mass Index (BMI) 40.0 and over, adult: Secondary | ICD-10-CM

## 2019-09-03 DIAGNOSIS — R259 Unspecified abnormal involuntary movements: Secondary | ICD-10-CM | POA: Diagnosis not present

## 2019-09-03 DIAGNOSIS — R519 Headache, unspecified: Secondary | ICD-10-CM

## 2019-09-03 DIAGNOSIS — Z9189 Other specified personal risk factors, not elsewhere classified: Secondary | ICD-10-CM

## 2019-09-03 DIAGNOSIS — G4489 Other headache syndrome: Secondary | ICD-10-CM | POA: Diagnosis not present

## 2019-09-03 DIAGNOSIS — R0683 Snoring: Secondary | ICD-10-CM

## 2019-09-03 NOTE — Progress Notes (Addendum)
Subjective:    Patient ID: Rachel [name] is a 39 y.o. female.  HPI     Star Age, MD, PhD [facility] [address], [address] [place], [place] [address]  Dear Dr. Jacelyn Grip,   I saw your patient, Rachel [name], upon your kind request in my neurologic clinic today for initial consultation of her recurrent headaches and facial twitching.  The patient is unaccompanied today.  As you know, Rachel [name] is a 39 year old right-handed woman with an underlying medical history of diabetes, vitamin D deficiency, hypertension, hyperlipidemia, recent UTI, and morbid obesity with a BMI of over 40, who reports recurrent dull headaches, particularly in the back of her head.  She also reports twitching around her eyes and twitching and sometimes muscle jerking involuntarily in her upper and lower extremities.  No particular time of the day, no obvious convulsive episodes, no prior history of migraines, has had some left ear fullness and pain.  She presented to the emergency room on 05/24/2019 with chills and had a fever.  She was treated for urinary tract infection and had also a Covid nasal swab done.  She reports that she had drainage through her left nostril with the test was administered afterwards.  She has been treated for a sinus infection and overall her ear symptoms and ear fullness, and nasal congestions have slowly improved.  For her headaches she gets some relief from taking over-the-counter ibuprofen.  She denies any significant photophobia or nausea or vomiting.  She has not had any one-sided weakness or numbness or tingling.  She has had intermittent tingling in the lower extremities at times.  She had some blood work through your office on 07/22/2019, we will request test results. You checked her vitamin D, CMP, lipid panel, A1c and urine culture.  She also reports that she had additional more recent blood work and urine testing as she was found to have blood in the urine.    She went back to the emergency room on 06/01/2019 secondary to drainage from the nose and severe left ear pain.  She was treated for a ear infection with amoxicillin, prior to that she was treated with Keflex.  She reported that her symptoms had started after the Covid 19 test was administered and she was worried that something was perforated as the test was administered.  She had a nasal speculum examination and was reassured, in particular since there was no blood and no obvious drainage was seen and she was reassured that no puncture had taken place.  I reviewed the emergency room records from 05/24/2019 as well as 06/01/2019. I reviewed your office note from 07/22/2019.  She has recently been seen by ENT for left-sided ear pain.  She was noted to have left middle ear effusion and was suspected to have a sinus infection versus URI and was treated with amoxicillin.  She had a maxillofacial CT without contrast on 07/11/2019 and I reviewed the results: IMPRESSION: No significant finding. The sinuses are clear except for small amount of fluid in a single posterior ethmoid air cell on the left.  She wears eyeglasses, she has not had an eye examination.  She feels that her eyes get twitchy.  She has had some involuntary twitching in her hands and legs, no pattern, does not happen every day.  She snores, she does not sleep very well.  She is sleepy during the day.  Epworth sleepiness score is 19 out of 24 today.  She lives  alone, she is single.  She does not smoke or drink alcohol and does not drink caffeine daily.  09/05/2019: Addendum: I reviewed patient's blood work from 09/03/2019: Urinalysis was unremarkable, CBC with differential showed hemoglobin of 11.9 and hematocrit of 36.2, both slightly reduced, CMP showed blood sugar of 148, BUN 11, creatinine 0.81, otherwise unremarkable findings, TSH was 2.55, vitamin D level was slightly below normal at 28.7, A1c was elevated at 7.6, this was on 08/05/2019.  Her  lipid panel from 08/05/2019 showed total cholesterol of 164, triglycerides 106, LDL 95.    Her Past Medical History Is Significant For: Past Medical History:  Diagnosis Date  . Diabetes mellitus without complication ([place])    pre diabetes  . High triglycerides   . Hypertension     Her Past Surgical History Is Significant For: History reviewed. No pertinent surgical history.  Her Family History Is Significant For: Family History  Problem Relation Age of Onset  . Breast cancer Maternal Aunt     Her Social History Is Significant For: Social History   Socioeconomic History  . Marital status: Single    Spouse name: Not on file  . Number of children: Not on file  . Years of education: Not on file  . Highest education level: Not on file  Occupational History  . Not on file  Tobacco Use  . Smoking status: Never Smoker  . Smokeless tobacco: Never Used  Substance and Sexual Activity  . Alcohol use: No  . Drug use: No  . Sexual activity: Not on file  Other Topics Concern  . Not on file  Social History Narrative  . Not on file   Social Determinants of Health   Financial Resource Strain:   . Difficulty of Paying Living Expenses: Not on file  Food Insecurity:   . Worried About Charity fundraiser in the Last Year: Not on file  . Ran Out of Food in the Last Year: Not on file  Transportation Needs:   . Lack of Transportation (Medical): Not on file  . Lack of Transportation (Non-Medical): Not on file  Physical Activity:   . Days of Exercise per Week: Not on file  . Minutes of Exercise per Session: Not on file  Stress:   . Feeling of Stress : Not on file  Social Connections:   . Frequency of Communication with Friends and Family: Not on file  . Frequency of Social Gatherings with Friends and Family: Not on file  . Attends Religious Services: Not on file  . Active Member of Clubs or Organizations: Not on file  . Attends Archivist Meetings: Not on file  . Marital  Status: Not on file    Her Allergies Are:  Allergies  Allergen Reactions  . Crestor [Rosuvastatin Calcium] Swelling    Swelling in the legs   :   Her Current Medications Are:  Outpatient Encounter Medications as of 09/03/2019  Medication Sig  . acetaminophen (TYLENOL) 325 MG tablet Take 650 mg by mouth every 6 (six) hours as needed for moderate pain.  Marland Kitchen amoxicillin (AMOXIL) 500 MG capsule Take 1 capsule (500 mg total) by mouth 3 (three) times daily.  . Ascorbic Acid (VITAMIN C) 1000 MG tablet Take 1,000 mg by mouth daily.  . cholecalciferol (VITAMIN D3) 25 MCG (1000 UT) tablet Take 1,000 Units by mouth daily.  . diazepam (VALIUM) 5 MG tablet Take 1 tablet (5 mg total) by mouth every 8 (eight) hours as needed for anxiety  or muscle spasms. (Patient not taking: Reported on 06/01/2019)  . glipiZIDE (GLUCOTROL) 5 MG tablet Take 5 mg by mouth 2 (two) times daily.  Marland Kitchen lisinopril (ZESTRIL) 10 MG tablet Take 10 mg by mouth daily.  . metFORMIN (GLUCOPHAGE) 500 MG tablet Take 500-1,000 mg by mouth See admin instructions. 1000mg  in the am and 500 mg in the pm  . Multiple Vitamins-Minerals (ZINC PO) Take 1 tablet by mouth daily.  . naproxen (NAPROSYN) 500 MG tablet Take 1 tablet (500 mg total) by mouth 2 (two) times daily. (Patient not taking: Reported on 06/01/2019)  . omeprazole (PRILOSEC) 20 MG capsule Take 20 mg by mouth daily.   . traMADol (ULTRAM) 50 MG tablet Take 1 tablet (50 mg total) by mouth every 6 (six) hours as needed. (Patient not taking: Reported on 06/01/2019)  . traMADol (ULTRAM) 50 MG tablet Take 50 mg by mouth every 6 (six) hours as needed for moderate pain.   No facility-administered encounter medications on file as of 09/03/2019.  :  Review of Systems:  Out of a complete 14 point review of systems, all are reviewed and negative with the exception of these symptoms as listed below: Review of Systems  Neurological:       Pt sts increase in headaches since Oct 2. Pt sts she had a  covid test on 10/2 and since she has had headaches along with sudden jerks of her arms, legs and hands. Pt notices her eyes will jump as well.  Pt sts she has been to see ENT and CT of Maxillary was completed. Results are in epic.   Epworth Sleepiness Scale 0= would never doze 1= slight chance of dozing 2= moderate chance of dozing 3= high chance of dozing  Sitting and reading:3 Watching TV:3 Sitting inactive in a public place (ex. Theater or meeting):3 As a passenger in a car for an hour without a break:3 Lying down to rest in the afternoon:3 Sitting and talking to someone:2 Sitting quietly after lunch (no alcohol):2 In a car, while stopped in traffic:0 Total:19    Objective:  Neurological Exam  Physical Exam Physical Examination:   Vitals:   09/03/19 1127  BP: 123/78  Pulse: 93  Temp: 97.9 F (36.6 C)   General Examination: The patient is a very pleasant 39 y.o. femalefemale in no acute distress. She appears well-developed and well-nourished and well groomed.   HEENT: Normocephalic, atraumatic, pupils are equal, round and reactive to light and accommodation. She wears corrective eyeglasses.  She is not photophobic but with light examination and attempts at funduscopic examination she keeps squinting her eyes so a funduscopic exam was not possible.  She has no ptosis, no blepharospasm, no abnormal involuntary facial movements, no lip, neck or jaw tremor.  Airway examination reveals marked airway crowding secondary to tonsillar size of 2-3+, larger uvula, larger tongue.  Mallampati is class II.  Tongue protrudes centrally in palate elevates symmetrically, no carotid bruits.  Speech is clear without dysarthria, hypophonia or voice tremor. Face is symmetric with normal facial animation and normal facial sensation to light touch, temperature and vibration.  Chest: Clear to auscultation without wheezing, rhonchi or crackles noted.  Heart: S1+S2+0, regular and normal without  murmurs, rubs or gallops noted.   Abdomen: Soft, non-tender and non-distended with normal bowel sounds appreciated on auscultation.  Extremities: There is no pitting edema in the distal lower extremities bilaterally. Pedal pulses are intact.  Skin: Warm and dry without trophic changes noted.  Musculoskeletal: exam reveals  No obvious joint swelling or joint deformities, But she reports aching in both hands and in the right pelvic area and lower abdominal area.   Neurologically:  Mental status: The patient is awake, alert and oriented in all 4 spheres. Her immediate and remote memory, attention, language skills and fund of knowledge are appropriate. There is no evidence of aphasia, agnosia, apraxia or anomia. Speech is clear with normal prosody and enunciation. Thought process is linear. Mood is normal and affect is normal.  Cranial nerves II - XII are as described above under HEENT exam. In addition: shoulder shrug is normal with equal shoulder height noted. Motor exam: Normal bulk, strength and tone is noted. There is no drift, tremor or rebound.  No abnormal involuntary movements are seen, no obvious myoclonus, no athetoid movements, no dystonic posturing. Romberg is negative. Reflexes are 1+ throughout. Babinski: Toes are flexor bilaterally. Fine motor skills and coordination: intact with normal finger taps, normal hand movements, normal rapid alternating patting, normal foot taps and normal foot agility.  Cerebellar testing: No dysmetria or intention tremor on finger to nose testing. Heel to shin is unremarkable bilaterally. There is no truncal or gait ataxia.  Sensory exam: intact to light touch, vibration, temperature sense  proprioception in the upper and lower extremities.  Gait, station and balance: She stands easily. No veering to one side is noted. No leaning to one side is noted. Posture is age-appropriate and stance is narrow based. Gait shows normal stride length and normal pace. No  problems turning are noted. Tandem walk is unremarkable.    Assessment and Plan:   In summary, Rachel [name] is a very pleasant 39 y.o.-year old female  with an underlying medical history of diabetes, vitamin D deficiency, hypertension, hyperlipidemia, recent UTI, and morbid obesity with a BMI of over 40, who Presents for evaluation of her recurrent headaches.  Her headaches are not in keeping with migraine headaches.  She has experienced some involuntary muscle jerking and muscle twitching.  She had no abnormal involuntary movements on examination today.  She is largely reassured that her neurological exam is nonfocal.  I could not do a funduscopic exam as she had squinting of her eyes but otherwise did not appear to be photophobic.  She is encouraged to schedule a formal eye examination for completion.  She had recent treatment for UTI with Keflex and For otitis with amoxicillin.  She reports that her sinus drainage has improved to some degree.  She is advised that we should proceed with a brain MRI with and without contrast due to new onset headaches.  Her headache started in the context of acute illness, nevertheless I would like to proceed with a brain scan and we will keep her posted as to her test results.  In addition, she appears to be at risk for obstructive sleep apnea what with her snoring, significant daytime somnolence and obesity as well as crowded airway noted.  She is advised to proceed with a sleep study.  Untreated obstructive sleep apnea can cause daytime somnolence And also recurrent headaches.  I explained to her that if she has obstructive sleep apnea she will benefit from treatment.  She is advised to proceed with CMP testing today in preparation for her brain MRI.  We will keep her posted as to her test results by phone call and I plan to follow her afterwards.  She reports aching in her hands and also in her lower back.  She is advised to talk to  you about seeing a rheumatologist.  She  is reminded to schedule a formal eye examination.  She is advised to continue with as needed ibuprofen or Tylenol for now.  I answered all her questions today and she was in agreement.  Thank you very much for allowing me to participate in the care of this nice patient. If I can be of any further assistance to you please do not hesitate to call me at [phone].  Sincerely,   Star Age, MD, PhD

## 2019-09-03 NOTE — Patient Instructions (Addendum)
Your neurological exam is normal thankfully, I could not see the background of your eyes, as you were more sensitive to light and squinting.  Please schedule a formal eye examination with your eye doctor. Other than that, your neurological exam is benign and reassuring, nevertheless, since you had a new onset of a headache recently, I would like to proceed with a brain MRI with and without contrast.  In addition, you have higher risk for obstructive sleep apnea and untreated sleep apnea can cause daytime sleepiness, you indicate significant daytime sleepiness.  We will call you to schedule your brain MRI and I also recommend a sleep study.  We will call you to schedule your sleep study.  For now, for intermittent headaches, please continue to use ibuprofen or Tylenol as needed.  You do not have a history in keeping with migraine headaches.  Please also follow-up with your ENT doctor as scheduled.  We will try to Get your recent blood test results from Dr. Jodi Mourning office.  In addition, we will repeat your kidney function test today to make sure you are safe to take the brain MRI with and without contrast.

## 2019-09-04 ENCOUNTER — Telehealth: Payer: Self-pay

## 2019-09-04 LAB — COMPREHENSIVE METABOLIC PANEL
ALT: 8 IU/L (ref 0–32)
AST: 12 IU/L (ref 0–40)
Albumin/Globulin Ratio: 1.6 (ref 1.2–2.2)
Albumin: 4.4 g/dL (ref 3.8–4.8)
Alkaline Phosphatase: 56 IU/L (ref 39–117)
BUN/Creatinine Ratio: 11 (ref 9–23)
BUN: 9 mg/dL (ref 6–20)
Bilirubin Total: 0.8 mg/dL (ref 0.0–1.2)
CO2: 21 mmol/L (ref 20–29)
Calcium: 9.6 mg/dL (ref 8.7–10.2)
Chloride: 102 mmol/L (ref 96–106)
Creatinine, Ser: 0.84 mg/dL (ref 0.57–1.00)
GFR calc Af Amer: 102 mL/min/{1.73_m2} (ref >59)
GFR calc non Af Amer: 88 mL/min/{1.73_m2} (ref >59)
Globulin, Total: 2.8 g/dL (ref 1.5–4.5)
Glucose: 120 mg/dL — ABNORMAL HIGH (ref 65–99)
Potassium: 4.6 mmol/L (ref 3.5–5.2)
Sodium: 137 mmol/L (ref 134–144)
Total Protein: 7.2 g/dL (ref 6.0–8.5)

## 2019-09-04 NOTE — Telephone Encounter (Signed)
-----   Message from Star Age, MD sent at 09/04/2019  8:04 AM EST ----- Kidney and liver fx okay. Will proceed with brain MRI w/wo contrast, pending ins auth. Please update pt.

## 2019-09-04 NOTE — Progress Notes (Signed)
Kidney and liver fx okay. Will proceed with brain MRI w/wo contrast, pending ins auth. Please update pt.

## 2019-09-04 NOTE — Telephone Encounter (Signed)
I reached out to the pt and lvm advising of results. Pt was advised to call back if she had any questions.

## 2019-09-16 ENCOUNTER — Ambulatory Visit: Payer: Managed Care, Other (non HMO) | Admitting: Certified Nurse Midwife

## 2019-09-16 ENCOUNTER — Encounter: Payer: Self-pay | Admitting: Certified Nurse Midwife

## 2019-09-16 ENCOUNTER — Other Ambulatory Visit: Payer: Self-pay

## 2019-09-16 ENCOUNTER — Other Ambulatory Visit (HOSPITAL_COMMUNITY)
Admission: RE | Admit: 2019-09-16 | Discharge: 2019-09-16 | Disposition: A | Discharge status: Home / Self Care | Payer: Managed Care, Other (non HMO) | Source: Ambulatory Visit | Attending: Certified Nurse Midwife | Admitting: Certified Nurse Midwife

## 2019-09-16 VITALS — BP 120/72 | HR 70 | Temp 96.9°F | Resp 16 | Ht 64.75 in | Wt 258.0 lb

## 2019-09-16 DIAGNOSIS — K641 Second degree hemorrhoids: Secondary | ICD-10-CM

## 2019-09-16 DIAGNOSIS — Z01419 Encounter for gynecological examination (general) (routine) without abnormal findings: Secondary | ICD-10-CM | POA: Diagnosis not present

## 2019-09-16 DIAGNOSIS — Z124 Encounter for screening for malignant neoplasm of cervix: Secondary | ICD-10-CM

## 2019-09-16 DIAGNOSIS — Z113 Encounter for screening for infections with a predominantly sexual mode of transmission: Secondary | ICD-10-CM | POA: Diagnosis not present

## 2019-09-16 DIAGNOSIS — Z0000 Encounter for general adult medical examination without abnormal findings: Secondary | ICD-10-CM

## 2019-09-16 DIAGNOSIS — E669 Obesity, unspecified: Secondary | ICD-10-CM | POA: Insufficient documentation

## 2019-09-16 DIAGNOSIS — N9489 Other specified conditions associated with female genital organs and menstrual cycle: Secondary | ICD-10-CM

## 2019-09-16 DIAGNOSIS — E663 Overweight: Secondary | ICD-10-CM

## 2019-09-16 DIAGNOSIS — A630 Anogenital (venereal) warts: Secondary | ICD-10-CM

## 2019-09-16 LAB — POCT URINALYSIS DIPSTICK
Bilirubin, UA: NEGATIVE
Blood, UA: NEGATIVE
Glucose, UA: NEGATIVE
Ketones, UA: NEGATIVE
Leukocytes, UA: NEGATIVE
Nitrite, UA: NEGATIVE
Protein, UA: NEGATIVE
Urobilinogen, UA: NEGATIVE E.U./dL — AB
pH, UA: 5 (ref 5.0–8.0)

## 2019-09-16 NOTE — Progress Notes (Addendum)
39 y.o. G0P0000 Single  African American Fe here to establish care and  for annual exam. Patient previous weight 277 09/03/2019.Sees Dr. Jacelyn Grip for Type 2 Diabetes and cholesterol and medication management. Periods are monthly duration 7 days with heavy bleeding, changes pad 3-4 times daily and then stops. This is a change for her. Cramping has started in the past 2 months, this is a change from her usual cycle..Not sexually active at present, desires STD screening. Patient was seen in ER in 05/2019 for hives.. Has noted some fatigue and some itching with skin. Has noted joint pain in toes and fingers. Discussed with Dr. Jacelyn Grip and the fatigue with this. Has noted some skin changes with spots on skin and nails peeling. He has checked her labs and is managing this.   Patient's last menstrual period was 09/02/2019 (exact date).          Sexually active: No.  The current method of family planning is abstinence.    Exercising: No.  exercise Smoker:  no  Review of Systems  Constitutional: Negative.   HENT: Negative.   Eyes: Negative.   Respiratory: Negative.   Cardiovascular: Negative.   Gastrointestinal: Negative.   Genitourinary:       Dull tender pain on left side of back, rt side shooting back pain, shooting pain in pelvis, pins & needles throughout body, fatigue in legs, knees & toes, a lot of itching on body  Musculoskeletal: Negative.   Skin: Negative.   Neurological: Negative.   Endo/Heme/Allergies: Negative.   Psychiatric/Behavioral: Negative.     Health Maintenance: Pap:  3-26yrs ago History of Abnormal Pap: no MMG:  1/19 bilateral & left breast u/s, left breast 03-16-18 category b birads 3:probably benign, recommended f/u 73mths (not done) Self Breast exams: no Colonoscopy:  Scheduled for tomorrow for rectal bleeding BMD:   none TDaP:  unsure Shingles: no Pneumonia: not done Hep C and HIV: HIV neg 2011 Labs: if needed   reports that she has quit smoking. She has never used  smokeless tobacco. She reports that she does not drink alcohol or use drugs.  Past Medical History:  Diagnosis Date  . Diabetes mellitus without complication ([facility])    pre diabetes  . High triglycerides   . Hypertension     History reviewed. No pertinent surgical history.  Current Outpatient Medications  Medication Sig Dispense Refill  . acetaminophen (TYLENOL) 325 MG tablet Take 650 mg by mouth every 6 (six) hours as needed for moderate pain.    . cholecalciferol (VITAMIN D3) 25 MCG (1000 UT) tablet Take 1,000 Units by mouth daily.    . cyclobenzaprine (FLEXERIL) 10 MG tablet TAKE 1 TABLET EVERY 8 HOURS AS NEEDED FOR PAIN OR MUSCLE SPASM FOR 10 DAYS    . diazepam (VALIUM) 5 MG tablet Take 1 tablet (5 mg total) by mouth every 8 (eight) hours as needed for anxiety or muscle spasms. 15 tablet 0  . diclofenac (VOLTAREN) 75 MG EC tablet Take 75 mg by mouth 2 (two) times daily.    Marland Kitchen gabapentin (NEURONTIN) 300 MG capsule TAKE 1 CAPSULE BY MOUTH EVERYDAY AT BEDTIME    . glipiZIDE (GLUCOTROL) 10 MG tablet Take 10 mg by mouth every morning.    Marland Kitchen lisinopril (ZESTRIL) 10 MG tablet Take 10 mg by mouth daily.    . metFORMIN (GLUCOPHAGE) 500 MG tablet Take 500-1,000 mg by mouth See admin instructions. 1000mg  in the am and 500 mg in the pm    . omeprazole (PRILOSEC  OTC) 20 MG tablet Take by mouth.    . traMADol (ULTRAM) 50 MG tablet Take 50 mg by mouth every 6 (six) hours as needed for moderate pain.     No current facility-administered medications for this visit.    Family History  Problem Relation Age of Onset  . Breast cancer Maternal Aunt   . Pancreatic cancer Maternal Uncle   . Colon cancer Maternal Grandmother   . Diabetes Maternal Grandmother   . Heart disease Maternal Grandfather     ROS:  Pertinent items are noted in HPI.  Otherwise, a comprehensive ROS was negative.  Exam:   BP 120/72   Pulse 70   Temp (!) 96.9 F (36.1 C) (Skin)   Resp 16   Ht 5' 4.75" (1.645 m)   Wt 258 lb  (117 kg)   LMP 09/02/2019 (Exact Date)   BMI 43.27 kg/m  Height: 5' 4.75" (164.5 cm) Ht Readings from Last 3 Encounters:  09/16/19 5' 4.75" (1.645 m)  09/03/19 5\' 5"  (1.651 m)  06/01/19 5\' 5"  (1.651 m)    General appearance: alert, cooperative and appears stated age Head: Normocephalic, without obvious abnormality, atraumatic Neck: no adenopathy, supple, symmetrical, trachea midline and thyroid normal to inspection and palpation Lungs: clear to auscultation bilaterally Breasts: normal appearance, no masses or tenderness, No nipple retraction or dimpling, No nipple discharge or bleeding, No axillary or supraclavicular adenopathy, Taught monthly breast self examination, large pendulous bilateral Heart: regular rate and rhythm Abdomen: soft, non-tender; no masses,  no organomegaly Extremities: extremities normal, atraumatic, no cyanosis or edema Skin: Skin color, texture, turgor normal. No rashes or lesions Lymph nodes: Cervical, supraclavicular, and axillary nodes normal. No abnormal inguinal nodes palpated Neurologic: Grossly normal   Pelvic: External genitalia:  no lesions              Urethra:  normal appearing urethra with no masses, tenderness or lesions              Bartholin's and Skene's: normal                 Vagina: normal appearing vagina with normal color and discharge, no lesions              Cervix: no cervical motion tenderness, no lesions and normal appearance              Pap taken: Yes.   Bimanual Exam:  Uterus:  enlarged, 12-14 weeks size, tilts to left              Adnexa: possible mass on left adnexal               Rectovaginal: Confirms               Anus:  normal sphincter tone, hemorrhoids noted with appears to be genital wart in the center.  Chaperone present: yes  A:  Well Woman with normal exam  Contraception none not sexually active  Menses change with menorrhagia and dysmenorrhea  Enlarged uterus  Possible mass on left adnexa  Possible genital  wart in rectal opening, beside hemorrhoids  STD screening      P:   Reviewed health and wellness pertinent to exam  Discussed pelvic exam findings and with period change feel PUS warranted for evaluation. Discussed possible uterine fibroids or adenomyosis which can also change period amount. Patient agreeable. She will be called with information and scheduled.  Discussed possible genital wart finding in rectal area with hemorrhoids and  feel this may need to biopsied or if not genital wart may need GI consult regarding. Patient to call if any rectal bleeding.  Lab: HIV,RPR, Hep C, Affirm Gc/Chlamydia  Pap smear: yes   counseled on breast self exam, STD prevention, HIV risk factors and prevention, feminine hygiene, adequate intake of calcium and vitamin D, diet and exercise  return annually or prn  An After Visit Summary was printed and given to the patient.

## 2019-09-16 NOTE — Patient Instructions (Signed)

## 2019-09-17 ENCOUNTER — Telehealth: Payer: Self-pay | Admitting: *Deleted

## 2019-09-17 DIAGNOSIS — N9489 Other specified conditions associated with female genital organs and menstrual cycle: Secondary | ICD-10-CM

## 2019-09-17 LAB — CYTOLOGY - PAP
Chlamydia: NEGATIVE
Comment: NEGATIVE
Comment: NEGATIVE
Comment: NORMAL
Diagnosis: NEGATIVE
High risk HPV: NEGATIVE
Neisseria Gonorrhea: NEGATIVE

## 2019-09-17 LAB — RPR: RPR Ser Ql: NONREACTIVE

## 2019-09-17 LAB — VAGINITIS/VAGINOSIS, DNA PROBE
Candida Species: NEGATIVE
Gardnerella vaginalis: NEGATIVE
Trichomonas vaginosis: NEGATIVE

## 2019-09-17 LAB — HEPATITIS C ANTIBODY: Hep C Virus Ab: <0.1 s/co ratio (ref 0.0–0.9)

## 2019-09-17 LAB — HIV ANTIBODY (ROUTINE TESTING W REFLEX): HIV Screen 4th Generation wRfx: NONREACTIVE

## 2019-09-17 NOTE — Telephone Encounter (Signed)
Spoke with patient. Patient request late afternoon appt. PUS scheduled for 09/26/19 at 3:30pm with consult at 4pm with Dr. Sabra Heck. Order placed for precert. Patient declines earlier appt offered. Patient verbalizes understanding and is agreeable.   Routing to provider for final review. Patient is agreeable to disposition. Will close encounter.  Cc: Dr. Sabra Heck, Lerry Liner, [place]

## 2019-09-17 NOTE — Telephone Encounter (Signed)
-----   Message from Regina Eck, CNM sent at 09/16/2019  5:02 PM EST ----- Patient needs PUS to r/o left adnexal mass and evaluate for menorrhagia.She also needs rectal area visualized to r/o genital wart vs hemorrhoids changes and may need GI

## 2019-09-19 ENCOUNTER — Telehealth: Payer: Self-pay | Admitting: Neurology

## 2019-09-19 NOTE — Telephone Encounter (Signed)
I called and spoke with the patient regarding the MRI she stated that her insurance told her if it is done in a providers office it will be covered 100 percent but if it is done in an imaging facility it will be 800 dollars.   I called to confirm this with her insurance and reiterating the same information to me.  [id number]

## 2019-09-19 NOTE — Telephone Encounter (Signed)
Pt called stating that they informed her at [facility] that she would have to pay $800 something for her MRI due to the order being coded OutPt. Pt would like to know if she can be scheduled here or at [facility] because she can not afford that right now. She states that at [facility] it can be coded as a Office Visit. Please advise.

## 2019-09-23 ENCOUNTER — Ambulatory Visit (INDEPENDENT_AMBULATORY_CARE_PROVIDER_SITE_OTHER): Payer: Managed Care, Other (non HMO) | Admitting: Neurology

## 2019-09-23 ENCOUNTER — Other Ambulatory Visit: Payer: Self-pay

## 2019-09-23 DIAGNOSIS — G4733 Obstructive sleep apnea (adult) (pediatric): Secondary | ICD-10-CM

## 2019-09-23 DIAGNOSIS — G4489 Other headache syndrome: Secondary | ICD-10-CM

## 2019-09-23 DIAGNOSIS — R0683 Snoring: Secondary | ICD-10-CM

## 2019-09-23 DIAGNOSIS — R519 Headache, unspecified: Secondary | ICD-10-CM

## 2019-09-23 DIAGNOSIS — Z9189 Other specified personal risk factors, not elsewhere classified: Secondary | ICD-10-CM

## 2019-09-23 DIAGNOSIS — R259 Unspecified abnormal involuntary movements: Secondary | ICD-10-CM

## 2019-09-24 ENCOUNTER — Other Ambulatory Visit: Payer: Self-pay

## 2019-09-24 ENCOUNTER — Telehealth: Payer: Self-pay | Admitting: Obstetrics & Gynecology

## 2019-09-24 NOTE — Telephone Encounter (Signed)
Patient has a PUS appointment 09/26/19 and has questions for Dr.Miller's nurse. She has a urology appointment the next day and wonders if this will cause any issues?

## 2019-09-24 NOTE — Telephone Encounter (Signed)
Spoke with patient. Advised patient she should be able to proceed with urology appt day following PUS, no contraindications. Recommended patient f/u with urology to confirm. Return call to office if any additional questions.   Routing to provider for final review. Patient is agreeable to disposition. Will close encounter.

## 2019-09-24 NOTE — Telephone Encounter (Signed)
Call placed to convey benefits for ultrasound appointment. Spoke with the patient and conveyed the benefits. Patient understands/agreeable with the benefits. Patient is aware of the cancellation policy. Appointment scheduled 09/26/19

## 2019-09-26 ENCOUNTER — Ambulatory Visit: Payer: Managed Care, Other (non HMO) | Admitting: Obstetrics & Gynecology

## 2019-09-26 ENCOUNTER — Encounter: Payer: Self-pay | Admitting: Obstetrics & Gynecology

## 2019-09-26 ENCOUNTER — Other Ambulatory Visit: Payer: Self-pay

## 2019-09-26 ENCOUNTER — Ambulatory Visit (INDEPENDENT_AMBULATORY_CARE_PROVIDER_SITE_OTHER): Payer: Managed Care, Other (non HMO)

## 2019-09-26 VITALS — BP 116/60 | HR 84 | Temp 97.3°F | Ht 65.0 in | Wt 261.2 lb

## 2019-09-26 DIAGNOSIS — N9489 Other specified conditions associated with female genital organs and menstrual cycle: Secondary | ICD-10-CM

## 2019-09-26 DIAGNOSIS — D219 Benign neoplasm of connective and other soft tissue, unspecified: Secondary | ICD-10-CM

## 2019-09-26 DIAGNOSIS — N852 Hypertrophy of uterus: Secondary | ICD-10-CM

## 2019-09-26 DIAGNOSIS — K629 Disease of anus and rectum, unspecified: Secondary | ICD-10-CM | POA: Diagnosis not present

## 2019-09-26 DIAGNOSIS — L299 Pruritus, unspecified: Secondary | ICD-10-CM | POA: Diagnosis not present

## 2019-09-26 MED ORDER — HYDROXYZINE HCL 10 MG PO TABS: ORAL_TABLET | ORAL | 0 refills | Status: DC

## 2019-09-26 NOTE — Progress Notes (Signed)
39 y.o. [place] American female here for pelvic ultrasound due to enlarged uterus on exam and h/o worsening menstrual cycles.  Cycles are aobut 7 days in length and heavy bleeding on several days.  On these days, she will need to change pads every 3-4 hours.  She is also starting ot have increased cramping with cycles.  Pt also needs lesion on rectum examined.  Separtely, pt has been experiencing episodes of body itching that seem to start on arms or extremities and then spread over most of skin.  Does not see a rash.  She does not seem to have any cause to this.  Will last weeks.  Hasn't been able to figure out cause.  Not sure if is an allergy.  Again, does see skin changes.  Eczema has not been diagnosed.  Has not seen dermatologist  Patient's last menstrual period was 09/02/2019 (exact date).  Contraception: not SA  Findings:  UTERUS: 13.8 x 10.7 x 8cm with multiple fibroids, largest is 12 x 10.8cm, 8.6 x 7.9cm, 4.1 x 3.8cm and 2.9 x 3.1cm EMS: 7.74mm ADNEXA: Left ovary: 3.4 x 2.5 x 2.7cm (this side difficult to see due to fibroid)       Right ovary: 4.7 x 2.5 x 2.8cm CUL DE SAC: no free fluid  Discussion:  Imagines reviewed and findings discussed with pt.  She has never been told she has fibroids.  She understands this is the cause of her bleeding and cramping.  Other symptoms of fibroids discussed.  Pt and I discussed treatment options for fibroids.  She has never been pregnant and does the possibility of future childbearing.  Myomectomy for treatment discussed.  Feel she would benefit from seeing Dr. Kerin Perna for her surgery.  Will likely need treatment for decreasing size prior to surgery.  Questions answered.  Rectal lesion also examined today.  Feathery lesion on external aspect of hemorrhoid noted.  This is concerning for condyloma.  Due to location, will plan to refer to Dr. Marcello Moores for possible biopsy/treatment.  Assessment:  Enlarged uterus with 12 cm  fibroid Menorrhagia with mild anemia diagnosed 05/24/2019 Abnormal appearing rectal lesion located on hemorrhoid Recurrent itchy skin condition  Plan:  Referral to Dr. Kerin Perna for myomectomy in pt who desires to maintain uterus for possible future childbearing Referral to Dr. Marcello Moores for possible biopsy/treatment of rectal lesion. She will try Atarax 10mg  nightly and up to TID for itching.  If helps, will plan to refer to dermatology.  Rx to pharmacy.  Pt will stay in touch about this.  After images reviewed and findings discussed, additional 30 minutes spent with pt discussing treatment options, future pregnancy desires, referral for surgery, rectal lesion and skin issues.

## 2019-09-30 ENCOUNTER — Telehealth: Payer: Self-pay

## 2019-09-30 NOTE — Addendum Note (Signed)
Addended by: Star Age on: 09/30/2019 08:15 AM   Modules accepted: Orders

## 2019-09-30 NOTE — Progress Notes (Signed)
Patient referred by Dr. Jacelyn Grip, seen by me on 09/03/19, HST on 09/23/19.    Please call and notify the patient that the recent home sleep test showed near-moderate obstructive sleep apnea. I recommend treatment for this in the form of autoPAP, which means, that we don't have to bring her back for a second sleep study with CPAP, but will let him try an autoPAP machine at home, through a DME company (of her choice, or as per insurance requirement). The DME representative will educate her on how to use the machine, how to put the mask on, etc. I have placed an order in the chart. Please send referral, talk to patient, send report to referring MD. We will need a FU in sleep clinic for 10 weeks post-PAP set up, please arrange that with me or one of our NPs. Thanks,   Star Age, MD, PhD [facility])

## 2019-09-30 NOTE — Procedures (Signed)
Patient Information     First Name: Rachel [name] Last Name: Dimond ID: [id number]  Birth Date: [birth year] Age: 39 Gender: Female  Referring Provider: Vernie Shanks, MD BMI: 44.1 (W=264 lb, H=5' 5'')    Epworth:  19/24   Sleep Study Information    Study Date: Sep 23, 2019 S/H/A Version: 001.001.001.001 / 4.1.1528 / 32  History:    39 year old with a history of diabetes, vitamin D deficiency, hypertension, hyperlipidemia, recent UTI, and morbid obesity with a BMI of over 40, who reports recurrent dull headaches, particularly in the back of her head.  Summary & Diagnosis:     OSA Recommendations:     This home sleep test demonstrates obstructive sleep apnea in the near moderate range by AHI criteria, with significant desaturations. Total AHI was 14.8/hour, and O2 nadir was 69%. Given the patient'smedical history and sleep related complaints, treatment with positive airway pressure is recommended. This will require a full night CPAP titration study for proper treatment settings, O2 monitoring and mask fitting. Based on the severity of the sleep disordered breathing an attended titration study is indicated. However, patient's insurance has denied an attended sleep study; therefore, the patient will be advised to proceed with an autoPAP titration/trial at home for now. Please note that untreated obstructive sleep apnea may carry additional perioperative morbidity. Patients with significant obstructive sleep apnea should receive perioperative PAP therapy and the surgeons and particularly the anesthesiologist should be informed of the diagnosis and the severity of the sleep disordered breathing. The patient should be cautioned not to drive, work at heights, or operate dangerous or heavy equipment when tired or sleepy. Review and reiteration of good sleep hygiene measures should be pursued with any patient. Other causes of the patient's symptoms, including circadian rhythm disturbances, an underlying mood disorder,  medication effect and/or an underlying medical problem cannot be ruled out based on this test. Clinical correlation is recommended. The patient and her referring provider will be notified of the test results. The patient will be seen in follow up in sleep clinic at [facility].  I certify that I have reviewed the raw data recording prior to the issuance of this report in accordance with the standards of the American Academy of Sleep Medicine (AASM).  Star Age, MD, PhD [facility]) Diplomat, ABPN (Neurology and Sleep)            Sleep Summary  Oxygen Saturation Statistics   Start Study Time: End Study Time: Total Recording Time:          11:01:04 PM 7:33:56 AM   8 h, 32 min  Total Sleep Time % REM of Sleep Time:  7 h, 42 min  23.7    Mean: 94 Minimum: 69 Maximum: 100  Mean of Desaturations Nadirs (%):   89  Oxygen Desaturation. %:  4-9 10-20 >20 Total  Events Number Total   30 78.9   6  2  15.8 5.3  38 100.0  Oxygen Saturation: <90 <=88  <85 <80 <70  Duration (minutes): Sleep % 2.7 0.6 1.8 0.4  0.8 0.4  0.2 0.1 0.0 0.0     Respiratory Indices      Total Events REM NREM All Night  pRDI:  113  pAHI:  87 ODI:  38  pAHIc:  0  % CSR: 0.0 45.0 41.4 26.4 0.0 13.0 8.4 1.7 0.0 19.2 14.8 6.5 0.0       Pulse Rate Statistics during Sleep (BPM)  Mean:  72 Minimum: 54 Maximum: 103    Indices are calculated using technically valid sleep time of  6 h, 53 min. pRDI/pAHI are calculated using oxi desaturations ? 3%  Body Position Statistics  Position Supine Prone Right Left Non-Supine  Sleep (min) 413.9 41.0 1.0 6.0 48.0  Sleep % 89.4 8.9 0.2 1.3 10.4  pRDI 21.0 3.4 N/A N/A 4.7  pAHI 16.2 1.7 N/A N/A 3.1  ODI 7.1 0.0 N/A N/A 1.6     Snoring Statistics Snoring Level (dB) >40 >50 >60 >70 >80 >Threshold (45)  Sleep (min) 214.0 14.0 2.1 0.0 0.0 42.0  Sleep % 46.2 3.0 0.5 0.0 0.0 9.1    Mean: 41 dB Sleep Stages Chart                        pAHI=14.8                                   Mild              Moderate                    Severe                                                 5              15                    30

## 2019-09-30 NOTE — Telephone Encounter (Signed)
I reached out to the pt and discussed recent HST. Pt verbalized understanding and was agreeable to starting the auto pap therapy.   Pt chose [facility] as her DME and f/u appt has been made for 01/13/2020.  Order has been sent to [facility].

## 2019-09-30 NOTE — Telephone Encounter (Signed)
-----   Message from Star Age, MD sent at 09/30/2019  8:15 AM EST ----- Patient referred by Dr. Jacelyn Grip, seen by me on 09/03/19, HST on 09/23/19.    Please call and notify the patient that the recent home sleep test showed near-moderate obstructive sleep apnea. I recommend treatment for this in the form of autoPAP, which means, that we don't have to bring her back for a second sleep study with CPAP, but will let him try an autoPAP machine at home, through a DME company (of her choice, or as per insurance requirement). The DME representative will educate her on how to use the machine, how to put the mask on, etc. I have placed an order in the chart. Please send referral, talk to patient, send report to referring MD. We will need a FU in sleep clinic for 10 weeks post-PAP set up, please arrange that with me or one of our NPs. Thanks,   Star Age, MD, PhD [facility])

## 2019-10-01 ENCOUNTER — Telehealth: Payer: Self-pay | Admitting: Certified Nurse Midwife

## 2019-10-01 NOTE — Telephone Encounter (Signed)
Visit Follow-Up Question Received: Today Message Contents  [name] sent to [place]  Phone Number: [phone]  [facility] Dr Neoma Laming,   [facility] all is well. I want to touch base with you in regards to some continuous severe pain and fatigue I feel in my elbows, fore arm down to my fingers and from my knees down to my toes and in my thighs at time. Also back of my neck to my shoulders,lower and upper back spasm that at times delay movements as usual. This started to happen in December 2020 it eased up somewhat in January it started again yesterday and it's so unbearable that I can't sleep. No pain or muscle relaxer works to ease the pain. Also my eyes constantly burns and hurt and I get pins and needle feeling all over by body. I am not sure what's going on and it's very concerning to me. Is this anyway possible related to the fibroids. Or what are your thoughts? Sorry for the long message. Thank you for your time.

## 2019-10-01 NOTE — Telephone Encounter (Signed)
Spoke with patient in f/u to MyChart message as seen below. Patient reviewed MyChart message as seen below. Patient denies any current GYN symptoms. Denies fever/chills, N/V. Advised patient to f/u with PCP or Urgent Care for further evaluation of symptoms.   Patient asking status of referral to general surgeon, advised patient per review of Epic, referral has been sent. Their office will be contacting you directly to schedule.   Questions answered. Advised I will forward to Melvia Heaps, CNM to review, our office will return call if any additional recommendations. Patient agreeable.   Patient was seen in office on 2/4 by Dr. Sabra Heck and 09/16/19 for AEX with Melvia Heaps, CNM.   Routing to provider for final review. Patient is agreeable to disposition. Will close encounter.  Cc: Dr. Sabra Heck, Magdalene Patricia

## 2019-10-02 NOTE — Telephone Encounter (Signed)
Agreeable with plan

## 2019-10-03 ENCOUNTER — Telehealth: Payer: Self-pay | Admitting: Neurology

## 2019-10-03 NOTE — Telephone Encounter (Signed)
Patients MRI has been approved Auth [id number] (03/07/20). Location is [facility] and only the patient can change this. I called to make her aware and schedule her but she did not answer so I left a VM asking her to call back.   Phone number to change request [phone]

## 2019-10-03 NOTE — Telephone Encounter (Signed)
Pt returned call regarding her MRI. Please call back when available.

## 2019-10-07 NOTE — Telephone Encounter (Signed)
Patient called back in regards to missed call for MRI please fu

## 2019-10-09 NOTE — Telephone Encounter (Signed)
Please refer to previous phone note. DWD  

## 2019-10-09 NOTE — Telephone Encounter (Signed)
Patient called back to schedule MRI please FU

## 2019-10-11 ENCOUNTER — Encounter (HOSPITAL_COMMUNITY): Payer: Self-pay

## 2019-10-11 ENCOUNTER — Emergency Department (HOSPITAL_COMMUNITY)
Admission: EM | Admit: 2019-10-11 | Discharge: 2019-10-11 | Disposition: A | Discharge status: Home / Self Care | Payer: Managed Care, Other (non HMO) | Attending: Emergency Medicine | Admitting: Emergency Medicine

## 2019-10-11 ENCOUNTER — Emergency Department (HOSPITAL_COMMUNITY): Payer: Managed Care, Other (non HMO)

## 2019-10-11 ENCOUNTER — Other Ambulatory Visit: Payer: Self-pay

## 2019-10-11 DIAGNOSIS — I10 Essential (primary) hypertension: Secondary | ICD-10-CM | POA: Insufficient documentation

## 2019-10-11 DIAGNOSIS — K5900 Constipation, unspecified: Secondary | ICD-10-CM | POA: Insufficient documentation

## 2019-10-11 DIAGNOSIS — R1032 Left lower quadrant pain: Secondary | ICD-10-CM

## 2019-10-11 DIAGNOSIS — E119 Type 2 diabetes mellitus without complications: Secondary | ICD-10-CM | POA: Diagnosis not present

## 2019-10-11 DIAGNOSIS — Z87891 Personal history of nicotine dependence: Secondary | ICD-10-CM | POA: Insufficient documentation

## 2019-10-11 DIAGNOSIS — Z79899 Other long term (current) drug therapy: Secondary | ICD-10-CM | POA: Diagnosis not present

## 2019-10-11 DIAGNOSIS — Z7984 Long term (current) use of oral hypoglycemic drugs: Secondary | ICD-10-CM | POA: Diagnosis not present

## 2019-10-11 DIAGNOSIS — N852 Hypertrophy of uterus: Secondary | ICD-10-CM | POA: Diagnosis not present

## 2019-10-11 LAB — COMPREHENSIVE METABOLIC PANEL
ALT: 13 U/L (ref 0–44)
AST: 14 U/L — ABNORMAL LOW (ref 15–41)
Albumin: 4 g/dL (ref 3.5–5.0)
Alkaline Phosphatase: 47 U/L (ref 38–126)
Anion gap: 10 (ref 5–15)
BUN: 8 mg/dL (ref 6–20)
CO2: 24 mmol/L (ref 22–32)
Calcium: 9.3 mg/dL (ref 8.9–10.3)
Chloride: 105 mmol/L (ref 98–111)
Creatinine, Ser: 0.78 mg/dL (ref 0.44–1.00)
GFR calc Af Amer: >60 mL/min (ref >60)
GFR calc non Af Amer: >60 mL/min (ref >60)
Glucose, Bld: 88 mg/dL (ref 70–99)
Potassium: 3.6 mmol/L (ref 3.5–5.1)
Sodium: 139 mmol/L (ref 135–145)
Total Bilirubin: 1.1 mg/dL (ref 0.3–1.2)
Total Protein: 7 g/dL (ref 6.5–8.1)

## 2019-10-11 LAB — CBC
HCT: 37.4 % (ref 36.0–46.0)
Hemoglobin: 11.5 g/dL — ABNORMAL LOW (ref 12.0–15.0)
MCH: 27.4 pg (ref 26.0–34.0)
MCHC: 30.7 g/dL (ref 30.0–36.0)
MCV: 89.3 fL (ref 80.0–100.0)
Platelets: 347 10*3/uL (ref 150–400)
RBC: 4.19 MIL/uL (ref 3.87–5.11)
RDW: 14.8 % (ref 11.5–15.5)
WBC: 7.5 10*3/uL (ref 4.0–10.5)
nRBC: 0 % (ref 0.0–0.2)

## 2019-10-11 LAB — URINALYSIS, ROUTINE W REFLEX MICROSCOPIC
Bacteria, UA: NONE SEEN
Bilirubin Urine: NEGATIVE
Glucose, UA: NEGATIVE mg/dL
Ketones, ur: 5 mg/dL — AB
Leukocytes,Ua: NEGATIVE
Nitrite: NEGATIVE
Protein, ur: NEGATIVE mg/dL
Specific Gravity, Urine: 1.015 (ref 1.005–1.030)
pH: 6 (ref 5.0–8.0)

## 2019-10-11 LAB — LIPASE, BLOOD: Lipase: 19 U/L (ref 11–51)

## 2019-10-11 LAB — I-STAT BETA HCG BLOOD, ED (MC, WL, AP ONLY): I-stat hCG, quantitative: <5 m[IU]/mL (ref <5)

## 2019-10-11 MED ORDER — OXYCODONE-ACETAMINOPHEN 5-325 MG PO TABS: 1.0000 | ORAL_TABLET | Freq: Three times a day (TID) | ORAL | 0 refills | Status: DC | PRN

## 2019-10-11 MED ORDER — SODIUM CHLORIDE 0.9% FLUSH: 3.0000 mL | Freq: Once | INTRAVENOUS | Status: DC

## 2019-10-11 NOTE — ED Notes (Signed)
E-signature not available, verbalized understanding of DC instructions and follow up care. Rx reviewed

## 2019-10-11 NOTE — ED Provider Notes (Signed)
[facility] EMERGENCY DEPARTMENT Provider Note   CSN: [id number] Arrival date & time: 10/11/19  1920     History Chief Complaint  Patient presents with  . Abdominal Pain    Rachel [name] is a 39 y.o. female history of hypertension, diabetes, obesity, uterine fibroids, presented emerge department abdominal pain.  She reports she is having pain in the left lower quadrant of her abdomen since October.  She believes she been having pain around her left flank as well.  She said this pain has been daily.  She feels it is getting worse.  She describes poor appetite and nausea at times.  She says she eats only 1 meal a day. She is having issues with bowel movements and has not a bowel movement in 3 days.  She is able to pass gas.  She recently saw an OB/GYN Dr. Hale Bogus, who performed an ultrasound to found that she had enlarged uterine fibroids.  She was referred to a surgeon, and has an appointment pending in March, but came to the ED today because she felt like her pain was getting worse.    NKDA No fever,s chills  HPI     Past Medical History:  Diagnosis Date  . Diabetes mellitus without complication ([place])    pre diabetes  . High triglycerides   . Hypertension   . Shingles     Patient Active Problem List   Diagnosis Date Noted  . Obesity 09/16/2019  . Deviated nasal septum 08/01/2019  . Hypertrophy of inferior nasal turbinate 08/01/2019  . Diabetes mellitus type II, controlled ([place]) 05/20/2014    History reviewed. No pertinent surgical history.   OB History    Gravida  0   Para  0   Term  0   Preterm  0   AB  0   Living  0     SAB  0   TAB  0   Ectopic  0   Multiple  0   Live Births  0           Family History  Problem Relation Age of Onset  . Breast cancer Maternal Aunt   . Pancreatic cancer Maternal Uncle   . Colon cancer Maternal Grandmother   . Diabetes Maternal Grandmother   . Heart disease Maternal Grandfather      Social History   Tobacco Use  . Smoking status: Former Research scientist (life sciences)  . Smokeless tobacco: Never Used  Substance Use Topics  . Alcohol use: No  . Drug use: No    Home Medications Prior to Admission medications   Medication Sig Start Date End Date Taking? Authorizing Provider  acetaminophen (TYLENOL) 325 MG tablet Take 650 mg by mouth every 6 (six) hours as needed for moderate pain.    [provider]  cholecalciferol (VITAMIN D3) 25 MCG (1000 UT) tablet Take 1,000 Units by mouth daily.    [provider]  cyclobenzaprine (FLEXERIL) 10 MG tablet TAKE 1 TABLET EVERY 8 HOURS AS NEEDED FOR PAIN OR MUSCLE SPASM FOR 10 DAYS 06/21/19   [provider]  diazepam (VALIUM) 5 MG tablet Take 1 tablet (5 mg total) by mouth every 8 (eight) hours as needed for anxiety or muscle spasms. 09/19/13   Kirichenko, Lahoma Rocker, PA-C  diclofenac (VOLTAREN) 75 MG EC tablet Take 75 mg by mouth 2 (two) times daily. 08/27/19   [provider]  gabapentin (NEURONTIN) 300 MG capsule TAKE 1 CAPSULE BY MOUTH EVERYDAY AT BEDTIME 07/22/19  [provider]  glipiZIDE (GLUCOTROL) 10 MG tablet Take 10 mg by mouth every morning. 08/12/19   [provider]  hydrocortisone (ANUSOL-HC) 2.5 % rectal cream  09/20/19   [provider]  hydrOXYzine (ATARAX/VISTARIL) 10 MG tablet 1 tab qhs with itching.  Can take up to TID as needed. 09/26/19   Megan Salon, MD  lisinopril (ZESTRIL) 10 MG tablet Take 10 mg by mouth daily. 04/27/19   [provider]  metFORMIN (GLUCOPHAGE) 500 MG tablet Take 500-1,000 mg by mouth See admin instructions. 1000mg  in the am and 500 mg in the pm 05/05/14   [provider]  omeprazole (PRILOSEC OTC) 20 MG tablet Take by mouth. 05/20/14   [provider]  oxyCODONE-acetaminophen (PERCOCET/ROXICET) 5-325 MG tablet Take 1 tablet by mouth every 8 (eight) hours as needed for up to 10 doses for severe pain. 10/11/19   Wyvonnia Dusky, MD   traMADol (ULTRAM) 50 MG tablet Take 50 mg by mouth every 6 (six) hours as needed for moderate pain.    [provider]    Allergies    Crestor [rosuvastatin calcium]  Review of Systems   Review of Systems  Constitutional: Negative for chills and fever.  Respiratory: Negative for cough and shortness of breath.   Gastrointestinal: Positive for abdominal pain, constipation and nausea. Negative for diarrhea.  Genitourinary: Positive for flank pain. Negative for dysuria and hematuria.  Musculoskeletal: Positive for arthralgias and back pain.  Psychiatric/Behavioral: Negative for agitation and confusion.  All other systems reviewed and are negative.   Physical Exam Updated Vital Signs BP 114/88 (BP Location: Right Arm)   Pulse 72   Temp 98.4 F (36.9 C) (Oral)   Resp 17   SpO2 100%   Physical Exam Vitals and nursing note reviewed.  Constitutional:      General: She is not in acute distress.    Appearance: She is well-developed. She is obese.  HENT:     Head: Normocephalic and atraumatic.  Eyes:     Conjunctiva/sclera: Conjunctivae normal.  Cardiovascular:     Rate and Rhythm: Normal rate and regular rhythm.     Heart sounds: No murmur.  Pulmonary:     Effort: Pulmonary effort is normal. No respiratory distress.     Breath sounds: Normal breath sounds.  Abdominal:     Palpations: Abdomen is soft.     Tenderness: There is abdominal tenderness in the suprapubic area. There is left CVA tenderness. There is no right CVA tenderness, guarding or rebound. Negative signs include McBurney's sign.  Musculoskeletal:     Cervical back: Neck supple.  Skin:    General: Skin is warm and dry.  Neurological:     General: No focal deficit present.     Mental Status: She is alert and oriented to person, place, and time.     ED Results / Procedures / Treatments   Labs (all labs ordered are listed, but only abnormal results are displayed) Labs Reviewed  COMPREHENSIVE METABOLIC  PANEL - Abnormal; Notable for the following components:      Result Value   AST 14 (*)    All other components within normal limits  CBC - Abnormal; Notable for the following components:   Hemoglobin 11.5 (*)    All other components within normal limits  URINALYSIS, ROUTINE W REFLEX MICROSCOPIC - Abnormal; Notable for the following components:   Hgb urine dipstick SMALL (*)    Ketones, ur 5 (*)    All  other components within normal limits  LIPASE, BLOOD  I-STAT BETA HCG BLOOD, ED ([facility], [facility], [facility] ONLY)    EKG None  Radiology CT Renal Stone Study  Result Date: 10/11/2019 CLINICAL DATA:  39 year old female with left flank pain. Concern for kidney stone. EXAM: CT ABDOMEN AND PELVIS WITHOUT CONTRAST TECHNIQUE: Multidetector CT imaging of the abdomen and pelvis was performed following the standard protocol without IV contrast. COMPARISON:  Abdominal CT dated 09/27/2019. FINDINGS: Evaluation of this exam is limited in the absence of intravenous contrast. Lower chest: The visualized lung bases are clear. No intra-abdominal free air or free fluid. Hepatobiliary: No focal liver abnormality is seen. No gallstones, gallbladder wall thickening, or biliary dilatation. Pancreas: Unremarkable. No pancreatic ductal dilatation or surrounding inflammatory changes. Spleen: Normal in size without focal abnormality. Adrenals/Urinary Tract: The adrenal glands, kidneys, visualized ureters, and urinary bladder appear unremarkable. Stomach/Bowel: There is diffuse colonic diverticulosis without active inflammatory changes. There is no bowel obstruction or active inflammation. The appendix is normal. Vascular/Lymphatic: The abdominal aorta and IVC unremarkable. No portal venous gas. There is no adenopathy. Reproductive: Enlarged myomatous uterus measuring up to 22 cm in length. Other: None Musculoskeletal: No acute or significant osseous findings. IMPRESSION: 1. No acute intra-abdominopelvic pathology. No hydronephrosis or  nephrolithiasis. 2. Colonic diverticulosis. No bowel obstruction or active inflammation. Normal appendix. 3. Enlarged myomatous uterus. Electronically Signed   By: Anner Crete M.D.   On: 10/11/2019 22:25    Procedures Procedures (including critical care time)  Medications Ordered in ED Medications  sodium chloride flush (NS) 0.9 % injection 3 mL (3 mLs Intravenous Not Given 10/11/19 2212)    ED Course  I have reviewed the triage vital signs and the nursing notes.  Pertinent labs & imaging results that were available during my care of the patient were reviewed by me and considered in my medical decision making (see chart for details).  39 yo female presenting with lower abdominal pain x 3 months, along with diminished appetite, constipation.  Reports she had a complete outpatient GI workup including colonoscopy which could not find cause of constipation.  She is pending OBGYN evaluation for fibroid removal.  She has left flank ttp today.  She has never had a CT abdomen pelvis.  I proceeded with the image to rule out the possibility of obstructing kidney stone.  The CT scan instead found evidence of a significant enlarged uterus.  I suspect this is likely the cause of her pain, and the compression of the mass is affecting both her bowel movements and her appetite.  There is no evidence of bowel obstruction here.  I do not believe this needs emergent intervention or surgery, but advised she f/u with her OBGYN, and I'll message Dr Sabra Heck, to see if she can get expedited surgical evaluation.  Otherwise her labs are unremarkable.  Low suspicion for bacterial infection, cholecystitis, appendicitis, diverticulitis.      Final Clinical Impression(s) / ED Diagnoses Final diagnoses:  Left lower quadrant abdominal pain  Constipation, unspecified constipation type  Enlarged uterus    Rx / DC Orders ED Discharge Orders         Ordered    oxyCODONE-acetaminophen (PERCOCET/ROXICET) 5-325 MG  tablet  Every 8 hours PRN     10/11/19 2253           Wyvonnia Dusky, MD 10/11/19 2258

## 2019-10-11 NOTE — ED Notes (Signed)
Sent a urine culture with the urine specimen 

## 2019-10-11 NOTE — ED Triage Notes (Signed)
Pt states that she has been having pain in her LUQ of her abd since December worse since Jan, also having L sided flank pain for that long and constipation.

## 2019-10-11 NOTE — Discharge Instructions (Addendum)
Your CT scan showed that you had an enlarged uterus.  You need to follow-up with your OB/GYN doctor about this.  I suspect this is likely the cause of your abdominal pain.  It may also be contributing to constipation.  For your constipation, we discussed trying Dulcolax, both by mouth and a suppository (by anus).   If this does not work after 24 hours, you can try magnesium citrate (over the counter) once.  Do NOT take laxatives on a daily basis.  You can start taking metamucil (fiber) and drink water daily to help with your bowel movements.  *  Please be aware that the percocet I prescribed is an opioid.  This medicine CAUSES worsening constipation.  I prescribed it only if you have intolerable pain to help you through the next few days.  Try to avoid taking opioids if at all possible.

## 2019-10-15 ENCOUNTER — Encounter: Payer: Self-pay | Admitting: Neurology

## 2019-10-15 ENCOUNTER — Other Ambulatory Visit: Payer: Self-pay

## 2019-10-15 ENCOUNTER — Telehealth: Payer: Self-pay | Admitting: *Deleted

## 2019-10-15 ENCOUNTER — Ambulatory Visit (INDEPENDENT_AMBULATORY_CARE_PROVIDER_SITE_OTHER): Payer: Managed Care, Other (non HMO)

## 2019-10-15 DIAGNOSIS — Z6841 Body Mass Index (BMI) 40.0 and over, adult: Secondary | ICD-10-CM

## 2019-10-15 DIAGNOSIS — G4489 Other headache syndrome: Secondary | ICD-10-CM | POA: Diagnosis not present

## 2019-10-15 DIAGNOSIS — R259 Unspecified abnormal involuntary movements: Secondary | ICD-10-CM

## 2019-10-15 DIAGNOSIS — R519 Headache, unspecified: Secondary | ICD-10-CM

## 2019-10-15 DIAGNOSIS — R0683 Snoring: Secondary | ICD-10-CM

## 2019-10-15 DIAGNOSIS — Z9189 Other specified personal risk factors, not elsewhere classified: Secondary | ICD-10-CM

## 2019-10-15 MED ORDER — GADOBENATE DIMEGLUMINE 529 MG/ML IV SOLN
20.0000 mL | Freq: Once | INTRAVENOUS | Status: AC | PRN
  Administered 2019-10-15: 20 mL via INTRAVENOUS

## 2019-10-15 MED ORDER — ORILISSA 200 MG PO TABS: 200.0000 mg | ORAL_TABLET | Freq: Two times a day (BID) | ORAL | 0 refills | Status: DC

## 2019-10-15 NOTE — Telephone Encounter (Signed)
Call placed to patient in f/u per Dr. Sabra Heck.   1. Reviewed recommendations for Orilissa. Rx for Orilissa 200 mg tab bid #60/0RF to verified pharmacy. Advised patient this medication may require PA, pharamcy will notify office if needed. Reviewed option of Doctor, general practice savings card. Patient is aware to call if any questions or assistance needed.   2. Patient states she is scheduled with Dr. Kerin Perna on 3/5 at 3:45pm, [place] location. RN called Dr. Charlett Lango office to confirm, this is a work in appt per Dr. Kerin Perna, spoke with [facility].  Questions answered. Patient is aware to call if any questions or concerns.   Routing to Dr. Sabra Heck for final review.   Cc: Magdalene Patricia

## 2019-10-17 NOTE — Progress Notes (Signed)
No abnormality in her brain appearance on brain MRI from 10/15/19. She has a variant in the appearance of the fluid space, which is an incidental finding. No abnormal lesions and overall benign results. Please update patient. She can FU as scheduled.

## 2019-10-21 ENCOUNTER — Telehealth: Payer: Self-pay

## 2019-10-21 NOTE — Telephone Encounter (Signed)
-----   Message from Star Age, MD sent at 10/17/2019  6:19 PM EST ----- No abnormality in her brain appearance on brain MRI from 10/15/19. She has a variant in the appearance of the fluid space, which is an incidental finding. No abnormal lesions and overall benign results. Please update patient. She can FU as scheduled.

## 2019-10-21 NOTE — Telephone Encounter (Signed)
I reached out to the pt and we were able to review MRI report.  Pt sts she has questions about the variant in the appearance of the fluid space and would like a f/u to discuss findings with MD.  Pt has been scheduled for 10/24/2019 at 1130 am.

## 2019-10-24 ENCOUNTER — Ambulatory Visit: Payer: Self-pay | Admitting: Neurology

## 2019-10-24 ENCOUNTER — Other Ambulatory Visit: Payer: Self-pay | Admitting: Family Medicine

## 2019-10-25 ENCOUNTER — Encounter: Payer: Self-pay | Admitting: Obstetrics & Gynecology

## 2019-10-28 NOTE — Telephone Encounter (Signed)
Dr. Miller -ok to close encounter?  

## 2019-10-30 ENCOUNTER — Other Ambulatory Visit: Payer: Self-pay | Admitting: Family Medicine

## 2019-10-30 DIAGNOSIS — R911 Solitary pulmonary nodule: Secondary | ICD-10-CM

## 2019-10-30 NOTE — Telephone Encounter (Signed)
Yes, ok to close encounter.  Thanks.

## 2019-10-30 NOTE — Telephone Encounter (Signed)
Encounter closed

## 2019-11-07 ENCOUNTER — Other Ambulatory Visit (HOSPITAL_COMMUNITY): Payer: Self-pay | Admitting: *Deleted

## 2019-11-07 DIAGNOSIS — R1310 Dysphagia, unspecified: Secondary | ICD-10-CM

## 2019-11-08 ENCOUNTER — Encounter: Payer: Self-pay | Admitting: Certified Nurse Midwife

## 2019-11-11 ENCOUNTER — Ambulatory Visit
Admission: RE | Admit: 2019-11-11 | Discharge: 2019-11-11 | Disposition: A | Discharge status: Home / Self Care | Payer: Managed Care, Other (non HMO) | Source: Ambulatory Visit | Attending: Family Medicine | Admitting: Family Medicine

## 2019-11-11 DIAGNOSIS — R911 Solitary pulmonary nodule: Secondary | ICD-10-CM

## 2019-11-13 ENCOUNTER — Other Ambulatory Visit: Payer: Self-pay

## 2019-11-13 ENCOUNTER — Encounter (HOSPITAL_COMMUNITY): Payer: Self-pay

## 2019-11-13 ENCOUNTER — Ambulatory Visit (HOSPITAL_COMMUNITY)
Admission: EM | Admit: 2019-11-13 | Discharge: 2019-11-13 | Disposition: A | Discharge status: Home / Self Care | Payer: Managed Care, Other (non HMO) | Attending: Emergency Medicine | Admitting: Emergency Medicine

## 2019-11-13 DIAGNOSIS — H18893 Other specified disorders of cornea, bilateral: Secondary | ICD-10-CM | POA: Diagnosis not present

## 2019-11-13 MED ORDER — FLUORESCEIN SODIUM 1 MG OP STRP
2.0000 | ORAL_STRIP | Freq: Once | OPHTHALMIC | Status: AC
  Administered 2019-11-13: 2 via OPHTHALMIC

## 2019-11-13 MED ORDER — TETRACAINE HCL 0.5 % OP SOLN
OPHTHALMIC | Status: AC
  Filled 2019-11-13: qty 4

## 2019-11-13 MED ORDER — TETRACAINE HCL 0.5 % OP SOLN
1.0000 [drp] | Freq: Once | OPHTHALMIC | Status: AC
  Administered 2019-11-13: 2 [drp] via OPHTHALMIC

## 2019-11-13 MED ORDER — FLUORESCEIN SODIUM 1 MG OP STRP
ORAL_STRIP | OPHTHALMIC | Status: AC
  Filled 2019-11-13: qty 1

## 2019-11-13 NOTE — ED Provider Notes (Signed)
HPI  SUBJECTIVE:  Cordy Goll is a 39 y.o. female who presents with 3 days of bilateral eye pain/irritation after putting Cortisporin in both of her eyes by accident.  She is supposed to be using Systane eyedrops.  She reports a mild headache, photophobia, and intermittently blurry vision that lasts seconds and resolves.  Reports pain with EOMs.  She tried flushing her eyes immediately and has been continuing to flush them vigorously with tap water and an eyewash.  No alleviating factors.  Symptoms are worse with exposure to light.  No foreign body sensation, periorbital erythema, edema.  No nausea vomiting double vision.  She has a past medical history of diabetes, hypertension.  LMP: 3/5.  Denies possibility being pregnant.  [id number], Edwyna Shell, MD Ophthalmology: [place]ated [place].  Cannot remember name.   Past Medical History:  Diagnosis Date  . Diabetes mellitus without complication ([place])    pre diabetes  . High triglycerides   . Hypertension   . Shingles     History reviewed. No pertinent surgical history.  Family History  Problem Relation Age of Onset  . Breast cancer Maternal Aunt   . Healthy Mother   . Pancreatic cancer Maternal Uncle   . Colon cancer Maternal Grandmother   . Diabetes Maternal Grandmother   . Heart disease Maternal Grandfather   . Healthy Father     Social History   Tobacco Use  . Smoking status: Former Research scientist (life sciences)  . Smokeless tobacco: Never Used  Substance Use Topics  . Alcohol use: No  . Drug use: No    No current facility-administered medications for this encounter.  Current Outpatient Medications:  .  acetaminophen (TYLENOL) 325 MG tablet, Take 650 mg by mouth every 6 (six) hours as needed for moderate pain., Disp: , Rfl:  .  cholecalciferol (VITAMIN D3) 25 MCG (1000 UT) tablet, Take 1,000 Units by mouth daily., Disp: , Rfl:  .  diclofenac (VOLTAREN) 75 MG EC tablet, Take 75 mg by mouth 2 (two) times daily., Disp: , Rfl:  .  Elagolix Sodium  (ORILISSA) 200 MG TABS, Take 200 mg by mouth in the morning and at bedtime., Disp: 60 tablet, Rfl: 0 .  gabapentin (NEURONTIN) 300 MG capsule, TAKE 1 CAPSULE BY MOUTH EVERYDAY AT BEDTIME, Disp: , Rfl:  .  glipiZIDE (GLUCOTROL) 10 MG tablet, Take 10 mg by mouth every morning., Disp: , Rfl:  .  hydrocortisone (ANUSOL-HC) 2.5 % rectal cream, , Disp: , Rfl:  .  hydrOXYzine (ATARAX/VISTARIL) 10 MG tablet, 1 tab qhs with itching.  Can take up to TID as needed., Disp: 30 tablet, Rfl: 0 .  lisinopril (ZESTRIL) 10 MG tablet, Take 10 mg by mouth daily., Disp: , Rfl:  .  metFORMIN (GLUCOPHAGE) 500 MG tablet, Take 500-1,000 mg by mouth See admin instructions. 1000mg  in the am and 500 mg in the pm, Disp: , Rfl:  .  omeprazole (PRILOSEC OTC) 20 MG tablet, Take by mouth., Disp: , Rfl:   Allergies  Allergen Reactions  . Crestor [Rosuvastatin Calcium] Swelling    Swelling in the legs      ROS  As noted in HPI.   Physical Exam  BP (!) 158/98   Pulse 88   Temp 99.8 F (37.7 C) (Oral)   Resp 16   Wt 115.7 kg   LMP 09/27/2019   SpO2 97%   BMI 42.43 kg/m   Constitutional: Well developed, well nourished, no acute distress Eyes:  EOMI, conjunctiva normal bilaterally PERRLA.  Positive bilateral  direct photophobia.  No consensual photophobia.  No hyphema.  No foreign body seen on lid eversion bilaterally.  No corneal abrasion seen on fluorescein exam bilaterally.  No periorbital erythema edema. Corrected visual acuity right 20/20 left 20/20 HENT: Normocephalic, atraumatic,mucus membranes moist Respiratory: Normal inspiratory effort Cardiovascular: Normal rate GI: nondistended skin: No rash, skin intact Musculoskeletal: no deformities Neurologic: Alert & oriented x 3, no focal neuro deficits Psychiatric: Speech and behavior appropriate   ED Course   Medications  tetracaine (PONTOCAINE) 0.5 % ophthalmic solution 1-2 drop (2 drops Both Eyes Given by Other 11/13/19 2015)  fluorescein ophthalmic  strip 2 strip (2 strips Both Eyes Given by Other 11/13/19 2015)    No orders of the defined types were placed in this encounter.   No results found for this or any previous visit (from the past 24 hour(s)). No results found.  ED Clinical Impression  1. Corneal irritation of both eyes      ED Assessment/Plan  Suspect residual irritation from the neomycin.  There is no evidence of corneal abrasion or foreign body.  Discussed with Dr. Posey Pronto, ophthalmologist on call.  He advised to not do anything further tonight.  He will see her tomorrow morning in the office to evaluate for an irritative iritis.  Advised patient to call office tomorrow and they will work her in.  Discussed MDM, treatment plan, and plan for follow-up with patient.  patient agrees with plan.   Meds ordered this encounter  Medications  . tetracaine (PONTOCAINE) 0.5 % ophthalmic solution 1-2 drop  . fluorescein ophthalmic strip 2 strip    *This clinic note was created using [employer]. Therefore, there may be occasional mistakes despite careful proofreading.   ?    Melynda Ripple, MD 11/14/19 [phone]

## 2019-11-13 NOTE — ED Triage Notes (Signed)
Pt states she is here with both eyes irritated because of using her prescribed ear drops in her eyes by accident this happened Sunday.

## 2019-11-13 NOTE — Discharge Instructions (Addendum)
I have spoken with Dr. Posey Pronto and he would like to see you in his office tomorrow.  Call his office first thing and tell them that you were seen here tonight and that Dr. Posey Pronto wanted to work you into the schedule.  Cool compresses.  Do not put anything further into your eyes until you are seen by Dr. Posey Pronto.

## 2019-11-19 ENCOUNTER — Encounter: Payer: Self-pay | Admitting: Family Medicine

## 2019-11-25 ENCOUNTER — Emergency Department (HOSPITAL_COMMUNITY): Payer: Managed Care, Other (non HMO)

## 2019-11-25 ENCOUNTER — Ambulatory Visit (HOSPITAL_COMMUNITY): Payer: Managed Care, Other (non HMO)

## 2019-11-25 ENCOUNTER — Encounter (HOSPITAL_COMMUNITY): Payer: Self-pay

## 2019-11-25 ENCOUNTER — Encounter: Payer: Self-pay | Admitting: Obstetrics & Gynecology

## 2019-11-25 ENCOUNTER — Ambulatory Visit (HOSPITAL_COMMUNITY): Admission: RE | Admit: 2019-11-25 | Payer: Managed Care, Other (non HMO) | Source: Ambulatory Visit

## 2019-11-25 ENCOUNTER — Other Ambulatory Visit: Payer: Self-pay

## 2019-11-25 ENCOUNTER — Telehealth: Payer: Self-pay | Admitting: Obstetrics & Gynecology

## 2019-11-25 ENCOUNTER — Emergency Department (HOSPITAL_COMMUNITY)
Admission: EM | Admit: 2019-11-25 | Discharge: 2019-11-25 | Disposition: A | Discharge status: Home / Self Care | Payer: Managed Care, Other (non HMO) | Attending: Emergency Medicine | Admitting: Emergency Medicine

## 2019-11-25 DIAGNOSIS — D259 Leiomyoma of uterus, unspecified: Secondary | ICD-10-CM | POA: Insufficient documentation

## 2019-11-25 DIAGNOSIS — R1084 Generalized abdominal pain: Secondary | ICD-10-CM | POA: Diagnosis present

## 2019-11-25 DIAGNOSIS — E119 Type 2 diabetes mellitus without complications: Secondary | ICD-10-CM | POA: Diagnosis not present

## 2019-11-25 DIAGNOSIS — R112 Nausea with vomiting, unspecified: Secondary | ICD-10-CM

## 2019-11-25 DIAGNOSIS — Z7984 Long term (current) use of oral hypoglycemic drugs: Secondary | ICD-10-CM | POA: Insufficient documentation

## 2019-11-25 LAB — COMPREHENSIVE METABOLIC PANEL
ALT: 13 U/L (ref 0–44)
AST: 18 U/L (ref 15–41)
Albumin: 3.7 g/dL (ref 3.5–5.0)
Alkaline Phosphatase: 49 U/L (ref 38–126)
Anion gap: 13 (ref 5–15)
BUN: 9 mg/dL (ref 6–20)
CO2: 21 mmol/L — ABNORMAL LOW (ref 22–32)
Calcium: 9.2 mg/dL (ref 8.9–10.3)
Chloride: 102 mmol/L (ref 98–111)
Creatinine, Ser: 0.79 mg/dL (ref 0.44–1.00)
GFR calc Af Amer: >60 mL/min (ref >60)
GFR calc non Af Amer: >60 mL/min (ref >60)
Glucose, Bld: 194 mg/dL — ABNORMAL HIGH (ref 70–99)
Potassium: 3.8 mmol/L (ref 3.5–5.1)
Sodium: 136 mmol/L (ref 135–145)
Total Bilirubin: 0.8 mg/dL (ref 0.3–1.2)
Total Protein: 7.1 g/dL (ref 6.5–8.1)

## 2019-11-25 LAB — CBC WITH DIFFERENTIAL/PLATELET
Abs Immature Granulocytes: 0.04 10*3/uL (ref 0.00–0.07)
Basophils Absolute: 0 10*3/uL (ref 0.0–0.1)
Basophils Relative: 0 %
Eosinophils Absolute: 0 10*3/uL (ref 0.0–0.5)
Eosinophils Relative: 0 %
HCT: 38.3 % (ref 36.0–46.0)
Hemoglobin: 11.9 g/dL — ABNORMAL LOW (ref 12.0–15.0)
Immature Granulocytes: 1 %
Lymphocytes Relative: 16 %
Lymphs Abs: 1.2 10*3/uL (ref 0.7–4.0)
MCH: 27.9 pg (ref 26.0–34.0)
MCHC: 31.1 g/dL (ref 30.0–36.0)
MCV: 89.7 fL (ref 80.0–100.0)
Monocytes Absolute: 0.3 10*3/uL (ref 0.1–1.0)
Monocytes Relative: 3 %
Neutro Abs: 6.3 10*3/uL (ref 1.7–7.7)
Neutrophils Relative %: 80 %
Platelets: 264 10*3/uL (ref 150–400)
RBC: 4.27 MIL/uL (ref 3.87–5.11)
RDW: 15.3 % (ref 11.5–15.5)
WBC: 7.8 10*3/uL (ref 4.0–10.5)
nRBC: 0 % (ref 0.0–0.2)

## 2019-11-25 LAB — CBG MONITORING, ED: Glucose-Capillary: 204 mg/dL — ABNORMAL HIGH (ref 70–99)

## 2019-11-25 LAB — URINALYSIS, ROUTINE W REFLEX MICROSCOPIC
Bilirubin Urine: NEGATIVE
Glucose, UA: NEGATIVE mg/dL
Hgb urine dipstick: NEGATIVE
Ketones, ur: 5 mg/dL — AB
Leukocytes,Ua: NEGATIVE
Nitrite: NEGATIVE
Protein, ur: 30 mg/dL — AB
Specific Gravity, Urine: 1.019 (ref 1.005–1.030)
pH: 8 (ref 5.0–8.0)

## 2019-11-25 LAB — HCG, SERUM, QUALITATIVE: Preg, Serum: NEGATIVE

## 2019-11-25 LAB — LIPASE, BLOOD: Lipase: 24 U/L (ref 11–51)

## 2019-11-25 MED ORDER — FENTANYL CITRATE (PF) 100 MCG/2ML IJ SOLN
50.0000 ug | Freq: Once | INTRAMUSCULAR | Status: AC
  Administered 2019-11-25: 50 ug via INTRAVENOUS
  Filled 2019-11-25: qty 2

## 2019-11-25 MED ORDER — ONDANSETRON 4 MG PO TBDP: 4.0000 mg | ORAL_TABLET | Freq: Three times a day (TID) | ORAL | 0 refills | Status: DC | PRN

## 2019-11-25 MED ORDER — IOHEXOL 300 MG/ML  SOLN
100.0000 mL | Freq: Once | INTRAMUSCULAR | Status: AC | PRN
  Administered 2019-11-25: 100 mL via INTRAVENOUS

## 2019-11-25 MED ORDER — ONDANSETRON HCL 4 MG/2ML IJ SOLN
4.0000 mg | Freq: Once | INTRAMUSCULAR | Status: AC
  Administered 2019-11-25: 4 mg via INTRAVENOUS
  Filled 2019-11-25: qty 2

## 2019-11-25 NOTE — ED Triage Notes (Signed)
4 hour hx of N/V and abd pain without diarrhea.  Pain radiates across lower abdomen.   Does c/o increased urination without other urinary sx.  Pt alert and oriented without active vomiting.

## 2019-11-25 NOTE — ED Notes (Signed)
Help get patient into a gown on the monitor checked patient cbg it was 204 notified RN Sharyn Lull of blood sugar patient is resting with call bell in reach

## 2019-11-25 NOTE — Telephone Encounter (Signed)
[facility] Planning question Received: Today Message Contents  Rachel [name] sent to [place]  Phone Number: [phone]  [facility] - [place] Dr Sabra Heck. Hope all is well. I was rush to the ER again for extreme stomach pain this time along with nausea and vomiting. [place] [facility] has not yet set a date for the the procedure. This is becoming really unbearable and concerning. I am at lost as what to do. In a follow up with my urology last week he said it's now pushing to the back of my bladder. Please advise.

## 2019-11-25 NOTE — ED Notes (Signed)
Walked patient to the bathroom patient did well 

## 2019-11-25 NOTE — ED Notes (Signed)
Took patient saline lock out patient is getting dress 

## 2019-11-25 NOTE — ED Provider Notes (Signed)
[facility] DEPARTMENT Provider Note   CSN: [id number] Arrival date & time: 11/25/19  [id number]     History No chief complaint on file.   Rachel [name] is a 39 y.o. female.  Presenting to ER with complaints of generalized abdominal pain.  Reports that she had relatively sudden onset of nausea, vomiting and then having abdominal pain.  Pain radiating across lower abdomen, sharp, stabbing.  No alleviating factors.  No aggravating factors.  Mostly dry heaves.  No blood in vomit, no blood in stool, no diarrhea or constipation.  Type 2 diabetes.  Denies prior abdominal surgical history.  HPI     Past Medical History:  Diagnosis Date  . Diabetes mellitus without complication ([place])    pre diabetes  . High triglycerides   . Hypertension   . Shingles     Patient Active Problem List   Diagnosis Date Noted  . Obesity 09/16/2019  . Deviated nasal septum 08/01/2019  . Hypertrophy of inferior nasal turbinate 08/01/2019  . Diabetes mellitus type II, controlled ([place]) 05/20/2014    No past surgical history on file.   OB History    Gravida  0   Para  0   Term  0   Preterm  0   AB  0   Living  0     SAB  0   TAB  0   Ectopic  0   Multiple  0   Live Births  0           Family History  Problem Relation Age of Onset  . Breast cancer Maternal Aunt   . Healthy Mother   . Pancreatic cancer Maternal Uncle   . Colon cancer Maternal Grandmother   . Diabetes Maternal Grandmother   . Heart disease Maternal Grandfather   . Healthy Father     Social History   Tobacco Use  . Smoking status: Former Research scientist (life sciences)  . Smokeless tobacco: Never Used  Substance Use Topics  . Alcohol use: No  . Drug use: No    Home Medications Prior to Admission medications   Medication Sig Start Date End Date Taking? Authorizing Provider  cholecalciferol (VITAMIN D3) 25 MCG (1000 UT) tablet Take 1,000 Units by mouth daily.   Yes [provider]  glipiZIDE  (GLUCOTROL) 10 MG tablet Take 10 mg by mouth every morning. 08/12/19  Yes [provider]  hydrocortisone (ANUSOL-HC) 2.5 % rectal cream Place 1 application rectally as needed for hemorrhoids.  09/20/19  Yes [provider]  hydrOXYzine (ATARAX/VISTARIL) 10 MG tablet 1 tab qhs with itching.  Can take up to TID as needed. Patient taking differently: Take 10 mg by mouth every 8 (eight) hours as needed for itching.  09/26/19  Yes Megan Salon, MD  lisinopril (ZESTRIL) 10 MG tablet Take 10 mg by mouth daily. 04/27/19  Yes [provider]  metFORMIN (GLUCOPHAGE) 500 MG tablet Take 500-1,000 mg by mouth See admin instructions. 1000mg  in the am and 500 mg in the pm 05/05/14  Yes [provider]  omeprazole (PRILOSEC OTC) 20 MG tablet Take 20 mg by mouth daily.  05/20/14  Yes [provider]  acetaminophen (TYLENOL) 325 MG tablet Take 650 mg by mouth every 6 (six) hours as needed for moderate pain.    [provider]  Elagolix Sodium (ORILISSA) 200 MG TABS Take 200 mg by mouth in the morning and at bedtime. Patient not taking: Reported on 11/25/2019 10/15/19   Sabra Heck,  Lemmie Evens, MD  ondansetron (ZOFRAN ODT) 4 MG disintegrating tablet Take 1 tablet (4 mg total) by mouth every 8 (eight) hours as needed for nausea or vomiting. 11/25/19   Lucrezia Starch, MD    Allergies    Crestor [rosuvastatin calcium]  Review of Systems   Review of Systems  Constitutional: Negative for chills and fever.  HENT: Negative for ear pain and sore throat.   Eyes: Negative for pain and visual disturbance.  Respiratory: Negative for cough and shortness of breath.   Cardiovascular: Negative for chest pain and palpitations.  Gastrointestinal: Positive for abdominal pain, nausea and vomiting.  Genitourinary: Negative for dysuria and hematuria.  Musculoskeletal: Negative for arthralgias and back pain.  Skin: Negative for color change and rash.  Neurological: Negative for seizures and  syncope.  All other systems reviewed and are negative.   Physical Exam Updated Vital Signs BP 110/75   Pulse 64   Temp 98.7 F (37.1 C) (Oral)   Resp (!) 21   Ht 5\' 5"  (1.651 m)   Wt 115.7 kg   LMP 10/25/2019 (Exact Date)   SpO2 97%   BMI 42.43 kg/m   Physical Exam Vitals and nursing note reviewed.  Constitutional:      General: She is not in acute distress.    Appearance: She is well-developed.  HENT:     Head: Normocephalic and atraumatic.  Eyes:     Conjunctiva/sclera: Conjunctivae normal.  Cardiovascular:     Rate and Rhythm: Normal rate and regular rhythm.     Heart sounds: No murmur.  Pulmonary:     Effort: Pulmonary effort is normal. No respiratory distress.     Breath sounds: Normal breath sounds.  Abdominal:     Palpations: Abdomen is soft.     Comments: Generalized abdominal tenderness to palpation, worse in left lower quadrant, no rebound or guarding  Musculoskeletal:        General: No deformity or signs of injury.     Cervical back: Neck supple.  Skin:    General: Skin is warm and dry.     Capillary Refill: Capillary refill takes less than 2 seconds.  Neurological:     General: No focal deficit present.     Mental Status: She is alert and oriented to person, place, and time.  Psychiatric:        Mood and Affect: Mood normal.        Behavior: Behavior normal.     ED Results / Procedures / Treatments   Labs (all labs ordered are listed, but only abnormal results are displayed) Labs Reviewed  URINALYSIS, ROUTINE W REFLEX MICROSCOPIC - Abnormal; Notable for the following components:      Result Value   APPearance TURBID (*)    Ketones, ur 5 (*)    Protein, ur 30 (*)    Bacteria, UA RARE (*)    All other components within normal limits  COMPREHENSIVE METABOLIC PANEL - Abnormal; Notable for the following components:   CO2 21 (*)    Glucose, Bld 194 (*)    All other components within normal limits  CBC WITH DIFFERENTIAL/PLATELET - Abnormal;  Notable for the following components:   Hemoglobin 11.9 (*)    All other components within normal limits  CBG MONITORING, ED - Abnormal; Notable for the following components:   Glucose-Capillary 204 (*)    All other components within normal limits  LIPASE, BLOOD  HCG, SERUM, QUALITATIVE    EKG None  Radiology CT  ABDOMEN PELVIS W CONTRAST  Result Date: 11/25/2019 CLINICAL DATA:  Left lower quadrant abdominal pain, nausea/vomiting, evaluate for diverticulitis EXAM: CT ABDOMEN AND PELVIS WITH CONTRAST TECHNIQUE: Multidetector CT imaging of the abdomen and pelvis was performed using the standard protocol following bolus administration of intravenous contrast. CONTRAST:  173mL OMNIPAQUE IOHEXOL 300 MG/ML  SOLN COMPARISON:  10/11/2019 FINDINGS: Lower chest: Trace right pleural fluid. Lung bases are otherwise clear. Hepatobiliary: Liver is within normal limits. Gallbladder is unremarkable. No intrahepatic or extrahepatic ductal dilatation. Pancreas: Within normal limits. Spleen: Within normal limits. Adrenals/Urinary Tract: Adrenal glands are within normal limits. Kidneys are within normal limits. No hydronephrosis. Bladder is within normal limits. Stomach/Bowel: Stomach is within normal limits. No evidence of bowel obstruction. Normal appendix (series 3/image 47). No colonic wall thickening or inflammatory changes. Vascular/Lymphatic: No evidence of abdominal aortic aneurysm. No suspicious abdominopelvic lymphadenopathy. Reproductive: Enlarged uterus with multiple uterine fibroids, including a dominant 15.9 cm intramural/subserosal left uterine body fibroid (series 3/image 63). Bilateral ovaries are within normal limits, noting a right corpus luteum, physiologic. Other: Trace adnexal ascites, likely physiologic. Musculoskeletal: Visualized osseous structures are within normal limits. IMPRESSION: No colonic wall thickening or inflammatory changes. Specifically, no evidence of diverticulitis. No evidence of  bowel obstruction. Normal appendix. Uterine fibroids, as above. Electronically Signed   By: Julian Hy M.D.   On: 11/25/2019 12:04    Procedures Procedures (including critical care time)  Medications Ordered in ED Medications  ondansetron (ZOFRAN) injection 4 mg (4 mg Intravenous Given 11/25/19 0912)  fentaNYL (SUBLIMAZE) injection 50 mcg (50 mcg Intravenous Given 11/25/19 1030)  iohexol (OMNIPAQUE) 300 MG/ML solution 100 mL (100 mLs Intravenous Contrast Given 11/25/19 1138)    ED Course  I have reviewed the triage vital signs and the nursing notes.  Pertinent labs & imaging results that were available during my care of the patient were reviewed by me and considered in my medical decision making (see chart for details).  Clinical Course as of Nov 24 1632  Mon Nov 25, 2019  1214 Rechecked, symptoms well controlled, will dc home   [RD]    Clinical Course User Index [RD] Lucrezia Starch, MD   MDM Rules/Calculators/A&P                      51-year-old girl presenting to ER with complaint of generalized abdominal pain, nausea and vomiting.  Here she was noted to be very well-appearing, had some generalized tenderness on my exam, so proceeded with labs, CT scan, UA.  CT negative.  Labs are grossly within normal limits.  She was provided symptomatic control and symptoms were well controlled.  She is tolerating p.o. without difficulty.  Suspect diabetic gastroparesis versus viral GI illness.  Recommend recheck with primary doctor, return for worsening symptoms.    After the discussed management above, the patient was determined to be safe for discharge.  The patient was in agreement with this plan and all questions regarding their care were answered.  ED return precautions were discussed and the patient will return to the ED with any significant worsening of condition.    Final Clinical Impression(s) / ED Diagnoses Final diagnoses:  Type 2 diabetes mellitus without complication,  unspecified whether long term insulin use ([facility])  Non-intractable vomiting with nausea, unspecified vomiting type  Uterine leiomyoma, unspecified location    Rx / DC Orders ED Discharge Orders         Ordered    ondansetron (ZOFRAN ODT) 4 MG  disintegrating tablet  Every 8 hours PRN     11/25/19 1222           Lucrezia Starch, MD 11/25/19 [id number]

## 2019-11-25 NOTE — Discharge Instructions (Addendum)
Recommend following up with gynecology regarding your fibroids.  Regarding your nausea and abdominal pain today, recommend follow-up with your primary doctor.  Take Zofran as needed for pain and nausea.  Return to ER if you develop worsening pain, vomiting, fever.

## 2019-11-26 NOTE — Telephone Encounter (Signed)
Call reviewed with Dr. Sabra Heck, call returned to patient.   Patient states she was not transitioned to another medication after stopping Freida Busman, plan was to have surgery.   Advised patient [facility] should be contacting her directly to schedule surgery per Jinny Blossom at [facility]. Advised Dr. Sabra Heck has left message for Dr. Kerin Perna, our office will f/u with recommendations. Will review FMLA once we review plan of care. Patient thankful for f/u and verbalizes understanding.   Routing to Dr. Sabra Heck.

## 2019-11-26 NOTE — Telephone Encounter (Signed)
Call to patient to provide update, no answer, no voicemail.

## 2019-11-26 NOTE — Telephone Encounter (Signed)
Can you call Dr. Charlett Lango office about surgical date and also for notes.  Is she supposed to be off the [place] or was she transitioned to another medication?  Thanks.

## 2019-11-26 NOTE — Telephone Encounter (Signed)
Spoke with patient. Patient was seen in ER on 11/25/19 for LLQ abdominal pain, N/V. Patient reports LLQ pain still present, 7/10. Unable to tolerate OTC motrin due to nausea. Patient was prescribed Zofran in ER, has not picked up Rx, will plan to pick up medication today. Last BM 11/24/19, reports constipation. Denies fever/chills. CT abdomen pelvis completed 11/25/19, uterine fibroids. No longer on Orilissa as advised per Dr. Kerin Perna. Seen by urology last week, was advise fibroid now pushing on back of bladder.    Patient has seen Dr. Kerin Perna for initial consult for myomectomy, patient is waiting for return call from their office to schedule surgery. Patient last contacted their office on 11/25/19. Patient is asking what her next steps in care are and requesting FMLA due to time she has missed from work.   Advised patient to f/u with Dr. Charlett Lango office regarding surgery planning. I will update Dr. Sabra Heck and ask about FMLA. Patient verbalizes understanding and is agreeable.   AEX scheduled for 09/21/20.   Dr. Sabra Heck -please review.

## 2019-11-26 NOTE — Telephone Encounter (Signed)
Spoke with [place] at [employer]. Was advised patient was seen on 10/25/19, notes not completed, can fax copy of PUS completed that day. Jinny Blossom will have their surgery scheduler reach out to patient.

## 2019-11-27 NOTE — Telephone Encounter (Signed)
She should still be on the orilissa so should call for refill until surgery is scheduled. Thanks.

## 2019-11-27 NOTE — Telephone Encounter (Signed)
Spoke with patient. Advised per Dr. Sabra Heck. Patient has not received a call from Dr. Charlett Lango office regarding surgery scheduling. Strongly encouraged patient to call for refill of Orilissa and request update on surgery scheduling.   Patient again requesting FMLA for time missed from work up until surgery is scheduled. Patient expressed concern about time missed from work. Patient also requesting referral to another fertility specialist. Advised patient I can review options with Dr. Sabra Heck when she returns to the office on 4/8 and return call. Again, encouraged patient to contact Dr. Charlett Lango office for refill, provide update and request update on surgery scheduling. Patient thankful for call and verbalizes understanding.     Routing to Dr. Sabra Heck.

## 2019-11-29 NOTE — Telephone Encounter (Signed)
Spoke with patient. Advised per Dr. Sabra Heck.   1.. Reviewed FMLA paperwork process /fee, patient verbalizes understanding, she will call if any questions/concerns.   2. Patient f/u with Dr. Charlett Lango office on 4/8, new RX sent for Orilissa, no update on surgery date.  3. Patient has additional questions about referral, would like to further discuss with Dr. Sabra Heck before proceeding. No referral placed. MyChart visit scheduled for 12/03/19 at 4:30pm.   Routing to provider for final review. Patient is agreeable to disposition. Will close encounter.  Cc: Reesa Chew, RN

## 2019-11-29 NOTE — Telephone Encounter (Signed)
I'm fine with the FMLA paper work for her.  I can refer her to [facility] for her myomectomy.  This would be a minimally invasive surgeon who does laparoscopic myomectomies and not a fertility specialist but she needs treatment for her fibroids now and can deal with fertility once healed from that if she desires.  She is ok with this, I would refer her to one of these three providers:  Guadalupe Maple, MD, Clide Dales, MD, or Arleen H. Jerl Santos, MD.  Phone for appts is [phone].

## 2019-12-03 ENCOUNTER — Telehealth (INDEPENDENT_AMBULATORY_CARE_PROVIDER_SITE_OTHER): Payer: Managed Care, Other (non HMO) | Admitting: Obstetrics & Gynecology

## 2019-12-03 ENCOUNTER — Encounter: Payer: Self-pay | Admitting: Obstetrics & Gynecology

## 2019-12-03 DIAGNOSIS — D219 Benign neoplasm of connective and other soft tissue, unspecified: Secondary | ICD-10-CM

## 2019-12-03 DIAGNOSIS — N852 Hypertrophy of uterus: Secondary | ICD-10-CM

## 2019-12-03 NOTE — Progress Notes (Signed)
Virtual Visit via Video Note  I connected with Rachel [name] on 12/03/19 at  4:30 PM EDT by a video enabled telemedicine application and verified that I am speaking with the correct person using two identifiers.  Location: Patient: car Provider: office   I discussed the limitations of evaluation and management by telemedicine and the availability of in person appointments. The patient expressed understanding and agreed to proceed.  History of Present Illness: 39 yo G1 SBF with hx of menorrhagia and enlarged fibroid uterus who was referred to Dr. Kerin Perna for uterus conserving treatment with myomectomy who is here to discuss possible change in providers.  Pt was seen for consultation and was started on Orilissa 200mg  BID.  Pt reports bleeding and pain were improved after a few weeks.  Her first cycle while on the Orilissa was improved as well.  Surgery was also recommended and she was to be called with this date.  However, she ran out of medication last week and it took multiple phone calls to be the prescription ordered again.  She does not have a surgery date scheduled.  Pt asked for other providers and names were given.  She wanted to have a virtual visit prior to going for referral to new provider.  Her major concern is the need for fertility treatment after the myomectomy.  Not actively trying for pregnancy but wants to know if it is bad to see one provider for surgery and later another one for fertility issues.  As major issue at hand is 12 x 10cm fibroid and 8.6 x 7.9cm fibroid as well as two smaller ones, I feel it is in the pt's best interest to proceed with laparoscopic myomectomy first.  Due to body habitus, feel laparoscopic specialty surgery would benefit her the most.  Pt voices understanding and as she is not actively planing for pregnancy feel we can address fertility issues in the future if needed.  Pt feels comfortable with referral so this will be placed.  All questions answered.      Observations/Objective: WNWD Black F, NAD  Assessment and Plan: Uterine fibroids with h/o menorrhagia, pelvic pain  Follow Up Instructions: Referral will be placed to [facility] to Dr. Rubie Maid.     I discussed the assessment and treatment plan with the patient. The patient was provided an opportunity to ask questions and all were answered. The patient agreed with the plan and demonstrated an understanding of the instructions.   The patient was advised to call back or seek an in-person evaluation if the symptoms worsen or if the condition fails to improve as anticipated.  I provided 20 minutes of non-face-to-face time during this encounter.   Megan Salon, MD

## 2019-12-16 ENCOUNTER — Other Ambulatory Visit: Payer: Self-pay

## 2019-12-16 ENCOUNTER — Encounter: Payer: Self-pay | Admitting: Registered"

## 2019-12-16 ENCOUNTER — Encounter: Payer: Managed Care, Other (non HMO) | Attending: Family Medicine | Admitting: Registered"

## 2019-12-16 DIAGNOSIS — E119 Type 2 diabetes mellitus without complications: Secondary | ICD-10-CM

## 2019-12-16 NOTE — Patient Instructions (Addendum)
Instructions/Goals:  Have 3 meals per day. Try to eat every 3-5 hours while awake.   Recommend 2-3 carbohydrate choices per meal (30-45 g carbohydrates per meal)  Include foods that you can swallow well. Recommend following up on barium swallow assessment.   Recommend including good, soft protein sources at each meal: beans, lentils, peanut butter, Greek yogurt, cheese, eggs  Blood Sugar Monitoring:   Check with insurance company regarding which glucose monitor is covered, then will want to call your doctor and ask for prescription for meter/strips/lancets that are covered  Recommend checking 2 times per day: fasting in morning (Goal: 80-130) and then 1-2 hours after one meal (Goal: less than 180).   Recommend requesting test for celiac disease due to GI symptoms following bread and pasta.   Recommend taking a multivitamin with iron (18 mg/day)  Make physical activity a part of your week. Try to include at least 30 minutes of physical activity 5 days each week or at least 150 minutes per week. Regular physical activity promotes overall health-including helping to reduce risk for heart disease and diabetes, promoting mental health, and helping [place] sleep better.

## 2019-12-16 NOTE — Progress Notes (Signed)
Diabetes Self-Management Education  Visit Type: First/Initial  Appt. Start Time: [id number] Appt. End Time: [id number]  12/16/2019  Ms. Rachel [name], identified by name and date of birth, is a 39 y.o. female with a diagnosis of Diabetes: Type 2.   ASSESSMENT  Height 5\' 5"  (1.651 m), weight 254 lb 14.4 oz (115.6 kg). Body mass index is 42.42 kg/m.   Pt present for appointment alone. Pt reports main goal for visit is for help with nutrition and weight loss. Reports she would like to be able to get off some of her medications. Reports being "good" with eating and then falling back into bad habits (regards to nutrition and sleep) when she gets stressed. Reports walks about 20 minutes 2 days during the week on work lunch break. Reports she has been stuck at 254 lb and needs to get in control of weight. Pt also wants to dedicate herself more in regards to type and portions of foods. Reports she does not include soda or juices. Drinks water and unsweet decaffeinated tea. Reports she loves fruits, but may be eating the "wrong" type of fruits per pt. Reports she feels once she is more educated about what to eat she will be on the right track.   Pt reports she needs to start checking her blood sugar levels so she can be aware of how to control her day to day activities, etc. Pt reports she was given a monitor but feels her strips are out of date. Reports monitor still works. Last Hgb A1c was 7.6 Pt reports she was on steroids during the time last one was taken.  Reports having feelings of shakiness, dizziness, jittery sometimes. Reports she either waits for it to pass or eats some fruit. Remembers having 1 time over past month. Pt was did not check due to not currently checking and strips out of date.    Reports bread, and pasta makes her constipated, sometimes causes stomach pain. Reports hx of severe constipation. Reports she was eating high fiber foods pt at time she suffered constipation. Reports she went to a GI  and reports colonoscopy came back normal. Reports she was not been tested for celiac before per pt. Pt unaware of any family with celiac but reports her family is not as knowledgeable about celiac so may not know even if someone did have it.   Pt also reports swallowing difficulties. Reports she eats chips, fruits because they they are easier for her to swallow. Reports having regurgitation feeling over past month when eating hard to swallow foods such as chicken. Denies any trouble swallowing liquid foods now, but reports doing so was difficult when she first started having swallowing problems. Reports fruits, chips, beans do well. Reports unable to eat meats well due to coarse/hard to chew texture. Reports she is supposed to have a barium swallow assessment, but she has not yet been contacted to schedule it. Pt reports all of her problems with eating started in October. Reports she has large fibroids which press on bladder and cause her to urinate often. Pt will be having a surgery for removal of fibroids. Pt reports doctor suspects this may be contributing to stomach constipation and pain. Consultation for surgery is in May.   Pt reports she has tried iron supplements before for past anemia, but made her more constipated.   Pt had to take a phone call during visit regarding upcoming surgery scheduling which limited time of visit. Will complete rest of DSME at follow-up appointment.  Pertinent Lab Values: 09/03/19:  HGB: 11.9 HCT: 36.2  08/05/19:  HgbA1c: 7.6  Diabetes Self-Management Education - 12/16/19 0928      Visit Information   Visit Type  First/Initial      Initial Visit   Diabetes Type  Type 2    Are you currently following a meal plan?  No    Are you taking your medications as prescribed?  Yes    Date Diagnosed  2015      Health Coping   How would you rate your overall health?  Fair      Psychosocial Assessment   Patient Belief/Attitude about Diabetes  Motivated to manage  diabetes    Self-care barriers  Unable to determine    Self-management support  Doctor's office;Friends    Other persons present  Patient    Patient Concerns  Nutrition/Meal planning;Weight Control    Special Needs  Unable to determine    Preferred Learning Style  No preference indicated    Learning Readiness  Ready    How often do you need to have someone help you when you read instructions, pamphlets, or other written materials from your doctor or pharmacy?  1 - Never    What is the last grade level you completed in school?  College      Pre-Education Assessment   Patient understands the diabetes disease and treatment process.  Needs Instruction    Patient understands incorporating nutritional management into lifestyle.  Needs Instruction    Patient undertands incorporating physical activity into lifestyle.  Needs Instruction    Patient understands using medications safely.  Demonstrates understanding / competency    Patient understands monitoring blood glucose, interpreting and using results  Needs Instruction    Patient understands prevention, detection, and treatment of acute complications.  Needs Instruction    Patient understands prevention, detection, and treatment of chronic complications.  Needs Instruction    Patient understands how to develop strategies to address psychosocial issues.  Needs Instruction    Patient understands how to develop strategies to promote health/change behavior.  Needs Instruction      Complications   Last HgB A1C per patient/outside source  7.6 %    How often do you check your blood sugar?  0 times/day (not testing)    Fasting Blood glucose range (mg/dL)  --   Pt not checking.   Postprandial Blood glucose range (mg/dL)  --   Pt not checking.   Number of hypoglycemic episodes per month  1    Can you tell when your blood sugar is low?  Yes    What do you do if your blood sugar is low?  Waits for it to pass or eats fruit    Number of hyperglycemic  episodes per week  --   Pt not currently checking blood sugar.   Have you had a dilated eye exam in the past 12 months?  Yes    Have you had a dental exam in the past 12 months?  Yes    Are you checking your feet?  No      Dietary Intake   Breakfast  10 AM: grapes, 2 strawberries, blueberries, 1 banana, water    Snack (morning)  None reported.    Lunch  2 PM: yeast roll    Snack (afternoon)  None reported.    Dinner  (After 8 PM): [employer]-brown rice, steak, salsa, sour cream, cheese, lettuce, water    Snack (evening)  None reported.  Beverage(s)  water      Exercise   Exercise Type  Light (walking / raking leaves);ADL's    How many days per week to you exercise?  2    How many minutes per day do you exercise?  20    Total minutes per week of exercise  40      Patient Education   Previous Diabetes Education  No    Disease state   Definition of diabetes, type 1 and 2, and the diagnosis of diabetes;Factors that contribute to the development of diabetes    Nutrition management   Food label reading, portion sizes and measuring food.;Carbohydrate counting;Role of diet in the treatment of diabetes and the relationship between the three main macronutrients and blood glucose level;Reviewed blood glucose goals for pre and post meals and how to evaluate the patients' food intake on their blood glucose level.    Monitoring  Purpose and frequency of SMBG.;Yearly dilated eye exam;Daily foot exams;Identified appropriate SMBG and/or A1C goals.    Acute complications  Taught treatment of hypoglycemia - the 15 rule.    Chronic complications  Dental care;Retinopathy and reason for yearly dilated eye exams      Individualized Goals (developed by patient)   Nutrition  Follow meal plan discussed;General guidelines for healthy choices and portions discussed    Physical Activity  Exercise 3-5 times per week;15 minutes per day;30 minutes per day    Medications  take my medication as prescribed     Monitoring   test my blood glucose as discussed      Post-Education Assessment   Patient understands the diabetes disease and treatment process.  Demonstrates understanding / competency    Patient understands incorporating nutritional management into lifestyle.  Demonstrates understanding / competency    Patient undertands incorporating physical activity into lifestyle.  Needs Instruction    Patient understands using medications safely.  Demonstrates understanding / competency    Patient understands monitoring blood glucose, interpreting and using results  Needs Review    Patient understands prevention, detection, and treatment of acute complications.  Demonstrates understanding / competency    Patient understands prevention, detection, and treatment of chronic complications.  Needs Review    Patient understands how to develop strategies to address psychosocial issues.  Demonstrates understanding / competency    Patient understands how to develop strategies to promote health/change behavior.  Demonstrates understanding / competency      Outcomes   Expected Outcomes  Demonstrated interest in learning. Expect positive outcomes    Future DMSE  --   3 weeks   Program Status  Not Completed       Individualized Plan for Diabetes Self-Management Training:   Learning Objective:  Patient will have a greater understanding of diabetes self-management. Patient education plan is to attend individual and/or group sessions per assessed needs and concerns.   Plan: Dietitian provided part of DSME. Due to shortened appointment time, partial DSME was provided. Discussed cause of diabetes and interaction with food intake, pt SMBG and goals, soft, easier to swallow proteins to include and importance of including at each meal. Discussed including easy to swallow foods and avoiding those that are hard to swallow to prevent choking or aspirating. Recommended pt follow-up regarding barium swallow appointment.  Recommended pt talk with GI about being tested for celiac disease as pt reports GI symptoms following intake of bread and pasta. Pt appeared agreeable to plan/information provided.   Instructions/Goals:  Have 3 meals per day. Try to eat  every 3-5 hours while awake.   Recommend 2-3 carbohydrate choices per meal (30-45 g carbohydrates per meal)  Include foods that you can swallow well. Recommend following up on barium swallow assessment.   Recommend including good, soft protein sources at each meal: beans, lentils, peanut butter, Greek yogurt, cheese, eggs  Blood Sugar Monitoring:   Check with insurance company regarding which glucose monitor is covered, then will want to call your doctor and ask for prescription for meter/strips/lancets that are covered  Recommend checking 2 times per day: fasting in morning (Goal: 80-130) and then 1-2 hours after one meal (Goal: less than 180).   Recommend requesting test for celiac disease due to GI symptoms following bread and pasta.   Recommend taking a multivitamin with iron (18 mg/day)  Make physical activity a part of your week. Try to include at least 30 minutes of physical activity 5 days each week or at least 150 minutes per week. Regular physical activity promotes overall health-including helping to reduce risk for heart disease and diabetes, promoting mental health, and helping [place] sleep better.   Patient Instructions  Instructions/Goals:  Have 3 meals per day. Try to eat every 3-5 hours while awake.   Recommend 2-3 carbohydrate choices per meal (30-45 g carbohydrates per meal)  Include foods that you can swallow well. Recommend following up on barium swallow assessment.   Recommend including good, soft protein sources at each meal: beans, lentils, peanut butter, Greek yogurt, cheese, eggs  Blood Sugar Monitoring:   Check with insurance company regarding which glucose monitor is covered, then will want to call your doctor and ask for  prescription for meter/strips/lancets that are covered  Recommend checking 2 times per day: fasting in morning (Goal: 80-130) and then 1-2 hours after one meal (Goal: less than 180).   Recommend requesting test for celiac disease due to GI symptoms following bread and pasta.   Recommend taking a multivitamin with iron (18 mg/day)  Make physical activity a part of your week. Try to include at least 30 minutes of physical activity 5 days each week or at least 150 minutes per week. Regular physical activity promotes overall health-including helping to reduce risk for heart disease and diabetes, promoting mental health, and helping [place] sleep better.     Expected Outcomes:  Demonstrated interest in learning. Expect positive outcomes  Education material provided: ADA - How to Thrive: A Guide for Your Journey with Diabetes, Meal plan card, My Plate and Snack sheet  If problems or questions, patient to contact team via:  Phone and Email  Future DSME appointment: (3 weeks)

## 2019-12-27 ENCOUNTER — Encounter (HOSPITAL_BASED_OUTPATIENT_CLINIC_OR_DEPARTMENT_OTHER): Payer: Self-pay | Admitting: Emergency Medicine

## 2019-12-27 ENCOUNTER — Emergency Department (HOSPITAL_BASED_OUTPATIENT_CLINIC_OR_DEPARTMENT_OTHER)
Admission: EM | Admit: 2019-12-27 | Discharge: 2019-12-28 | Disposition: A | Discharge status: Home / Self Care | Payer: Managed Care, Other (non HMO) | Attending: Emergency Medicine | Admitting: Emergency Medicine

## 2019-12-27 ENCOUNTER — Other Ambulatory Visit: Payer: Self-pay

## 2019-12-27 DIAGNOSIS — Z87891 Personal history of nicotine dependence: Secondary | ICD-10-CM | POA: Insufficient documentation

## 2019-12-27 DIAGNOSIS — Z7984 Long term (current) use of oral hypoglycemic drugs: Secondary | ICD-10-CM | POA: Insufficient documentation

## 2019-12-27 DIAGNOSIS — R0789 Other chest pain: Secondary | ICD-10-CM

## 2019-12-27 DIAGNOSIS — E119 Type 2 diabetes mellitus without complications: Secondary | ICD-10-CM | POA: Diagnosis not present

## 2019-12-27 DIAGNOSIS — Z79899 Other long term (current) drug therapy: Secondary | ICD-10-CM | POA: Diagnosis not present

## 2019-12-27 DIAGNOSIS — R1310 Dysphagia, unspecified: Secondary | ICD-10-CM | POA: Insufficient documentation

## 2019-12-27 DIAGNOSIS — R079 Chest pain, unspecified: Secondary | ICD-10-CM | POA: Diagnosis present

## 2019-12-27 HISTORY — DX: Leiomyoma of uterus, unspecified: D25.9

## 2019-12-27 HISTORY — DX: Obesity, unspecified: E66.9

## 2019-12-27 NOTE — ED Triage Notes (Signed)
Pt c/o sharp chest pain in middle of chest with dizziness and hot flashes x a few hours.

## 2019-12-27 NOTE — ED Triage Notes (Signed)
Pt reports symptoms increased after taking evening dose of Orillissa.

## 2019-12-28 ENCOUNTER — Emergency Department (HOSPITAL_BASED_OUTPATIENT_CLINIC_OR_DEPARTMENT_OTHER): Payer: Managed Care, Other (non HMO)

## 2019-12-28 LAB — CBC WITH DIFFERENTIAL/PLATELET
Abs Immature Granulocytes: 0.01 10*3/uL (ref 0.00–0.07)
Basophils Absolute: 0 10*3/uL (ref 0.0–0.1)
Basophils Relative: 0 %
Eosinophils Absolute: 0.1 10*3/uL (ref 0.0–0.5)
Eosinophils Relative: 1 %
HCT: 36.5 % (ref 36.0–46.0)
Hemoglobin: 11.6 g/dL — ABNORMAL LOW (ref 12.0–15.0)
Immature Granulocytes: 0 %
Lymphocytes Relative: 28 %
Lymphs Abs: 2.1 10*3/uL (ref 0.7–4.0)
MCH: 27.9 pg (ref 26.0–34.0)
MCHC: 31.8 g/dL (ref 30.0–36.0)
MCV: 87.7 fL (ref 80.0–100.0)
Monocytes Absolute: 0.6 10*3/uL (ref 0.1–1.0)
Monocytes Relative: 7 %
Neutro Abs: 4.9 10*3/uL (ref 1.7–7.7)
Neutrophils Relative %: 64 %
Platelets: 221 10*3/uL (ref 150–400)
RBC: 4.16 MIL/uL (ref 3.87–5.11)
RDW: 15 % (ref 11.5–15.5)
WBC: 7.6 10*3/uL (ref 4.0–10.5)
nRBC: 0 % (ref 0.0–0.2)

## 2019-12-28 LAB — COMPREHENSIVE METABOLIC PANEL
ALT: 13 U/L (ref 0–44)
AST: 18 U/L (ref 15–41)
Albumin: 4.1 g/dL (ref 3.5–5.0)
Alkaline Phosphatase: 46 U/L (ref 38–126)
Anion gap: 10 (ref 5–15)
BUN: 10 mg/dL (ref 6–20)
CO2: 25 mmol/L (ref 22–32)
Calcium: 9.1 mg/dL (ref 8.9–10.3)
Chloride: 103 mmol/L (ref 98–111)
Creatinine, Ser: 0.76 mg/dL (ref 0.44–1.00)
GFR calc Af Amer: >60 mL/min (ref >60)
GFR calc non Af Amer: >60 mL/min (ref >60)
Glucose, Bld: 50 mg/dL — ABNORMAL LOW (ref 70–99)
Potassium: 4.1 mmol/L (ref 3.5–5.1)
Sodium: 138 mmol/L (ref 135–145)
Total Bilirubin: 0.9 mg/dL (ref 0.3–1.2)
Total Protein: 7.5 g/dL (ref 6.5–8.1)

## 2019-12-28 LAB — TROPONIN I (HIGH SENSITIVITY): Troponin I (High Sensitivity): <2 ng/L (ref <18)

## 2019-12-28 LAB — URINALYSIS, ROUTINE W REFLEX MICROSCOPIC
Bilirubin Urine: NEGATIVE
Glucose, UA: NEGATIVE mg/dL
Hgb urine dipstick: NEGATIVE
Ketones, ur: NEGATIVE mg/dL
Leukocytes,Ua: NEGATIVE
Nitrite: NEGATIVE
Protein, ur: NEGATIVE mg/dL
Specific Gravity, Urine: 1.01 (ref 1.005–1.030)
pH: 7.5 (ref 5.0–8.0)

## 2019-12-28 MED ORDER — OMEPRAZOLE 20 MG PO CPDR
20.0000 mg | DELAYED_RELEASE_CAPSULE | Freq: Every day | ORAL | 0 refills | Status: DC
Start: 2019-12-28 — End: 2021-08-11

## 2019-12-28 MED ORDER — PANTOPRAZOLE SODIUM 40 MG PO TBEC
40.0000 mg | DELAYED_RELEASE_TABLET | Freq: Once | ORAL | Status: AC
  Administered 2019-12-28: 40 mg via ORAL
  Filled 2019-12-28: qty 1

## 2019-12-28 MED ORDER — PANTOPRAZOLE SODIUM 40 MG IV SOLR: 40.0000 mg | Freq: Once | INTRAVENOUS | Status: DC

## 2019-12-28 NOTE — Discharge Instructions (Signed)
Call [phone] Monday morning to schedule your swallowing study at the [facility] radiology department.  The results of this study should be available to you and your physician through "MyChart".  It would be best to have the study done before your first appointment with gastroenterology.

## 2019-12-28 NOTE — ED Provider Notes (Signed)
[facility] Provider Note: Georgena Spurling, MD, FACEP  CSN: [id number] MRN: [id number] ARRIVAL: 12/27/19 at [place]: [place]  Chest Pain   HISTORY OF PRESENT ILLNESS  12/28/19 12:31 AM Rachel [name] is a 39 y.o. female with several weeks of discomfort in her upper chest.  She describes this as a sharp pain, and rates it as an 8 out of 10.  It is worse with swallowing and she has the sensation that food takes longer to go down to her stomach that it should.  She feels like her esophagus gets blocked off temporarily after swallowing, particularly solids.  She has never had complete obstruction of her esophagus that would not resolve.  She is equivocal about her symptoms being worse with deep breathing.  She is here this evening because of dizziness (lightheadedness) and chills (also described as hot flashes) that happened several times beginning about 8 PM.  Symptoms were worse after taking her evening dose of Oralissa which is a known estrogen blocker.   Past Medical History:  Diagnosis Date  . Diabetes mellitus without complication ([place])    pre diabetes  . High triglycerides   . Hypertension   . Obesity   . Shingles   . Sleep apnea   . Uterine fibroid     History reviewed. No pertinent surgical history.  Family History  Problem Relation Age of Onset  . Breast cancer Maternal Aunt   . Healthy Mother   . Pancreatic cancer Maternal Uncle   . Colon cancer Maternal Grandmother   . Diabetes Maternal Grandmother   . Hypertension Maternal Grandmother   . Heart disease Maternal Grandfather   . Heart attack Maternal Grandfather   . Healthy Father   . Hypertension Other     Social History   Tobacco Use  . Smoking status: Former Research scientist (life sciences)  . Smokeless tobacco: Never Used  Substance Use Topics  . Alcohol use: No  . Drug use: No    Prior to Admission medications   Medication Sig Start Date End Date Taking? Authorizing Provider  acetaminophen  (TYLENOL) 325 MG tablet Take 650 mg by mouth every 6 (six) hours as needed for moderate pain.    [provider]  cholecalciferol (VITAMIN D3) 25 MCG (1000 UT) tablet Take 1,000 Units by mouth daily.    [provider]  Elagolix Sodium (ORILISSA) 200 MG TABS Take 200 mg by mouth in the morning and at bedtime. 10/15/19   Megan Salon, MD  glipiZIDE (GLUCOTROL) 10 MG tablet Take 10 mg by mouth every morning. 08/12/19   [provider]  hydrocortisone (ANUSOL-HC) 2.5 % rectal cream Place 1 application rectally as needed for hemorrhoids.  09/20/19   [provider]  hydrOXYzine (ATARAX/VISTARIL) 10 MG tablet 1 tab qhs with itching.  Can take up to TID as needed. Patient taking differently: Take 10 mg by mouth every 8 (eight) hours as needed for itching.  09/26/19   Megan Salon, MD  lisinopril (ZESTRIL) 10 MG tablet Take 10 mg by mouth daily. 04/27/19   [provider]  metFORMIN (GLUCOPHAGE) 500 MG tablet Take 500-1,000 mg by mouth See admin instructions. 1000mg  in the am and 500 mg in the pm 05/05/14   [provider]  omeprazole (PRILOSEC) 20 MG capsule Take 1 capsule (20 mg total) by mouth daily. 12/28/19   Kenyotta Dorfman, MD  ondansetron (ZOFRAN ODT) 4 MG disintegrating tablet Take 1 tablet (4 mg total) by mouth  every 8 (eight) hours as needed for nausea or vomiting. 11/25/19   Lucrezia Starch, MD  omeprazole (PRILOSEC OTC) 20 MG tablet Take 20 mg by mouth daily.  05/20/14 12/28/19  [provider]    Allergies Crestor [rosuvastatin calcium]   REVIEW OF SYSTEMS  Negative except as noted here or in the History of Present Illness.   PHYSICAL EXAMINATION  Initial Vital Signs Blood pressure 140/79, pulse 71, temperature 98.3 F (36.8 C), temperature source Oral, resp. rate 14, height 5\' 5"  (1.651 m), weight 114.8 kg, SpO2 100 %.  Examination General: Well-developed, well-nourished female in no acute distress; appearance consistent with  age of record HENT: normocephalic; atraumatic; normal pharynx Eyes: pupils equal, round and reactive to light; extraocular muscles intact Neck: supple Heart: regular rate and rhythm Lungs: clear to auscultation bilaterally Abdomen: soft; nondistended; nontender; bowel sounds present Extremities: No deformity; full range of motion; pulses normal Neurologic: Awake, alert and oriented; motor function intact in all extremities and symmetric; no facial droop Skin: Warm and dry Psychiatric: Normal mood and affect   RESULTS  Summary of this visit's results, reviewed and interpreted by myself:   EKG Interpretation  Date/Time:  Friday Dec 27 2019 23:08:21 EDT Ventricular Rate:  75 PR Interval:    QRS Duration: 90 QT Interval:  376 QTC Calculation: 420 R Axis:   63 Text Interpretation: Sinus rhythm Normal ECG No previous ECGs available Confirmed by Marceline Napierala [id number]) on 12/27/2019 11:20:11 PM      Laboratory Studies: Results for orders placed or performed during the hospital encounter of 12/27/19 (from the past 24 hour(s))  CBC with Differential     Status: Abnormal   Collection Time: 12/27/19 11:55 PM  Result Value Ref Range   WBC 7.6 4.0 - 10.5 K/uL   RBC 4.16 3.87 - 5.11 MIL/uL   Hemoglobin 11.6 (L) 12.0 - 15.0 g/dL   HCT 36.5 36.0 - 46.0 %   MCV 87.7 80.0 - 100.0 fL   MCH 27.9 26.0 - 34.0 pg   MCHC 31.8 30.0 - 36.0 g/dL   RDW 15.0 11.5 - 15.5 %   Platelets 221 150 - 400 K/uL   nRBC 0.0 0.0 - 0.2 %   Neutrophils Relative % 64 %   Neutro Abs 4.9 1.7 - 7.7 K/uL   Lymphocytes Relative 28 %   Lymphs Abs 2.1 0.7 - 4.0 K/uL   Monocytes Relative 7 %   Monocytes Absolute 0.6 0.1 - 1.0 K/uL   Eosinophils Relative 1 %   Eosinophils Absolute 0.1 0.0 - 0.5 K/uL   Basophils Relative 0 %   Basophils Absolute 0.0 0.0 - 0.1 K/uL   Immature Granulocytes 0 %   Abs Immature Granulocytes 0.01 0.00 - 0.07 K/uL  Comprehensive metabolic panel     Status: Abnormal   Collection Time:  12/27/19 11:55 PM  Result Value Ref Range   Sodium 138 135 - 145 mmol/L   Potassium 4.1 3.5 - 5.1 mmol/L   Chloride 103 98 - 111 mmol/L   CO2 25 22 - 32 mmol/L   Glucose, Bld 50 (L) 70 - 99 mg/dL   BUN 10 6 - 20 mg/dL   Creatinine, Ser 0.76 0.44 - 1.00 mg/dL   Calcium 9.1 8.9 - 10.3 mg/dL   Total Protein 7.5 6.5 - 8.1 g/dL   Albumin 4.1 3.5 - 5.0 g/dL   AST 18 15 - 41 U/L   ALT 13 0 - 44 U/L   Alkaline Phosphatase 46  38 - 126 U/L   Total Bilirubin 0.9 0.3 - 1.2 mg/dL   GFR calc non Af Amer >60 >60 mL/min   GFR calc Af Amer >60 >60 mL/min   Anion gap 10 5 - 15  Troponin I (High Sensitivity)     Status: None   Collection Time: 12/27/19 11:55 PM  Result Value Ref Range   Troponin I (High Sensitivity) <2 <18 ng/L  Urinalysis, Routine w reflex microscopic     Status: Abnormal   Collection Time: 12/28/19 12:46 AM  Result Value Ref Range   Color, Urine STRAW (A) YELLOW   APPearance CLEAR CLEAR   Specific Gravity, Urine 1.010 1.005 - 1.030   pH 7.5 5.0 - 8.0   Glucose, UA NEGATIVE NEGATIVE mg/dL   Hgb urine dipstick NEGATIVE NEGATIVE   Bilirubin Urine NEGATIVE NEGATIVE   Ketones, ur NEGATIVE NEGATIVE mg/dL   Protein, ur NEGATIVE NEGATIVE mg/dL   Nitrite NEGATIVE NEGATIVE   Leukocytes,Ua NEGATIVE NEGATIVE   Imaging Studies: DG Chest 2 View  Result Date: 12/28/2019 CLINICAL DATA:  39 year old female with dysphagia. EXAM: CHEST - 2 VIEW COMPARISON:  Chest radiograph dated 05/24/2019. FINDINGS: No focal consolidation, pleural effusion or pneumothorax. The cardiac silhouette is within limits. No acute osseous pathology. IMPRESSION: No active cardiopulmonary disease. Electronically Signed   By: Anner Crete M.D.   On: 12/28/2019 01:11    ED COURSE and MDM  Nursing notes, initial and subsequent vitals signs, including pulse oximetry, reviewed and interpreted by myself.  Vitals:   12/28/19 0130 12/28/19 0200 12/28/19 0230 12/28/19 0300  BP: 138/75 (!) 137/92 124/82 117/81  Pulse:  73 71 73 77  Resp: 18 13 18 10   Temp:      TempSrc:      SpO2: 100% 100% 97% 99%  Weight:      Height:       Medications  pantoprazole (PROTONIX) EC tablet 40 mg (40 mg Oral Given 12/28/19 0113)   2:53 AM The patient's chest discomfort is consistent with esophageal etiology given its worsening with swallowing and a sensation of difficulty passing food.  The cause of the patient's lightheadedness and hot flashes tonight are likely unrelated and perhaps a reaction to her estrogen blocking medication.   PROCEDURES  Procedures   ED DIAGNOSES     ICD-10-CM   1. Chest discomfort  R07.89   2. Odynophagia  R13.10        Jahniya Duzan, Jenny Reichmann, MD 12/28/19 [phone]

## 2020-01-01 ENCOUNTER — Encounter: Payer: Self-pay | Admitting: Obstetrics & Gynecology

## 2020-01-01 ENCOUNTER — Telehealth: Payer: Self-pay | Admitting: Obstetrics & Gynecology

## 2020-01-01 NOTE — Telephone Encounter (Signed)
Please let pt know many of the symptoms she described are side effects esp hot flashes, joint pain, mood changes, and hot flashes.  We can do "add back therapy" with estrogen and progesterone to see if this helps.  Also, medication can increase blood pressure and she takes lisinopril so she needs to check BP.  If does not have BP cuff, can come in for OV or BP check.  If comes in, we could discuss in person add back therapy as well.  Thanks.

## 2020-01-01 NOTE — Telephone Encounter (Signed)
Advance Care Planning question Received: Today Message Contents  [name] sent to [facility]  Phone Number: [phone]  Hi Dr. Sabra Heck hope all is well. Guardian fax the intermittent FMLA papers. Please let me know if it was received. In addition I am having a hard time with taking this set of Rachel [name] I been having some major headaches, I am getting light headed and real dizzy to the point where I have to stop what I am doing. The dizziness is followed up with sweating and shivering. In addition to vomiting a few times with stomach pain. Not sure if all related but what are the side effects of Orillisa? Thank you.

## 2020-01-01 NOTE — Telephone Encounter (Signed)
I am concerned she would start bleeding again but I understand if she feels she needs to stop medication prior to appt.  Decision is really hers to make.

## 2020-01-01 NOTE — Telephone Encounter (Signed)
Spoke with pt. Pt given recommendations per Dr Sabra Heck. Pt requesting if can just stop Orlissa and not use any further medications at this time and wait for consult on 01/13/20 with Dr Rubie Maid and give her body a "break" with meds. Pt asking if there would be any  more effects that would occur when/if stopping Orlissa?  Advised will review with Dr Sabra Heck and return call to pt. Pt agreeable. Pt states will finish last pack of Orlissa this Sunday 01/05/20.   Routing to Dr Sabra Heck

## 2020-01-01 NOTE — Telephone Encounter (Signed)
Routing to Dr Sabra Heck per Dr Talbert Nan to review and recommend.

## 2020-01-01 NOTE — Telephone Encounter (Signed)
Left message for pt to return call to triage RN. 

## 2020-01-01 NOTE — Telephone Encounter (Signed)
Started Orlissa  09/2019 200mg  BID AEX 09/16/19 with DL Last OV with SM on 09/26/19 for fibroid.  Referred 09/29/19 to Dr Kerin Perna  Referred to Dr Rubie Maid for lap myomectomy to preserve fertility on 12/03/19 Type 2 diabetes on metformin   Spoke with pt.   1. Pt states wanting to know if we received her intermittent FMLA papers. Pt advised our office had not and pt verified fax number for our office and will call and get update on status.  2. Pt states possibly having some side effects from taking Orlissa. Pt states sx of headaches, N/V, abd pain, hot flashes, joint pain in knees, mood changes and dizziness. Pt states was given new Rx of Orlissa on 11/28/19 and started having sx on 12/27/19 and has continued. Pt states first round of Orlissa that was written on 09/2019 did not give sx.  Pt able to go to work where she is currently.  Pt states has referral consult appt with Dr Rubie Maid on 01/13/20 for lap myomectomy.  Pt denies fever, chills, change in bowel movements. States has usual normal constipation for her. No change in diet or meds. Denies decreased blood sugars.  Advised will review sx with Dr Talbert Nan on Dr Ammie Ferrier behalf since not in office today. Will return call to pt with recommendations/advice. Pt agreeable.   Routing to Dr Talbert Nan.

## 2020-01-02 NOTE — Telephone Encounter (Signed)
Left message for pt to return call to triage RN. 

## 2020-01-06 ENCOUNTER — Ambulatory Visit (HOSPITAL_COMMUNITY)
Admission: RE | Admit: 2020-01-06 | Discharge: 2020-01-06 | Disposition: A | Discharge status: Home / Self Care | Payer: Managed Care, Other (non HMO) | Source: Ambulatory Visit | Attending: Emergency Medicine | Admitting: Emergency Medicine

## 2020-01-06 ENCOUNTER — Other Ambulatory Visit: Payer: Self-pay

## 2020-01-06 DIAGNOSIS — R1310 Dysphagia, unspecified: Secondary | ICD-10-CM | POA: Insufficient documentation

## 2020-01-07 NOTE — Telephone Encounter (Signed)
Spoke to pt. Pt given recommendations per Dr Sabra Heck. Pt states stopped Orlissa on 01/01/20 due to effects. Will keep consult with Dr Rubie Maid on 01/13/20. Pt states having no bleeding so far. Heavy bleeding precautions given. Pt verbalized understanding.   Routing to Dr Sabra Heck for review. Encounter closed.

## 2020-01-10 ENCOUNTER — Telehealth: Payer: Self-pay | Admitting: Obstetrics & Gynecology

## 2020-01-10 ENCOUNTER — Encounter: Payer: Self-pay | Admitting: Obstetrics & Gynecology

## 2020-01-10 NOTE — Telephone Encounter (Signed)
Non-Urgent Medical Question Received: Today Message Contents  [name] sent to [place]  Phone Number: [phone]  Hi Dr. Sabra Heck thank you for the follow up. Regarding the FMLA. I fax the paper work last week and [employer] as well faxed it a few weeks back please let me know if it was received. I can also email it in case the fax is not received. For it to go into effect [employer] is requesting the paper work before June 1st. Thank you have a great day.

## 2020-01-13 ENCOUNTER — Ambulatory Visit: Payer: Self-pay | Admitting: Neurology

## 2020-01-22 ENCOUNTER — Ambulatory Visit: Payer: Managed Care, Other (non HMO) | Admitting: Registered"

## 2020-02-17 ENCOUNTER — Ambulatory Visit: Payer: Managed Care, Other (non HMO) | Admitting: Neurology

## 2020-02-17 ENCOUNTER — Telehealth: Payer: Self-pay | Admitting: Neurology

## 2020-02-17 ENCOUNTER — Encounter: Payer: Self-pay | Admitting: Neurology

## 2020-02-17 ENCOUNTER — Other Ambulatory Visit: Payer: Self-pay

## 2020-02-17 VITALS — BP 127/82 | HR 72 | Ht 65.0 in | Wt 248.3 lb

## 2020-02-17 DIAGNOSIS — G4489 Other headache syndrome: Secondary | ICD-10-CM

## 2020-02-17 DIAGNOSIS — Z9989 Dependence on other enabling machines and devices: Secondary | ICD-10-CM

## 2020-02-17 DIAGNOSIS — G4734 Idiopathic sleep related nonobstructive alveolar hypoventilation: Secondary | ICD-10-CM | POA: Diagnosis not present

## 2020-02-17 DIAGNOSIS — R519 Headache, unspecified: Secondary | ICD-10-CM

## 2020-02-17 DIAGNOSIS — M542 Cervicalgia: Secondary | ICD-10-CM

## 2020-02-17 DIAGNOSIS — G4733 Obstructive sleep apnea (adult) (pediatric): Secondary | ICD-10-CM

## 2020-02-17 NOTE — Progress Notes (Signed)
Subjective:    Rachel [name] ID: Rachel Rachel [name] Rachel [name] is a 39 y.o. female.  HPI     Interim history:  Rachel Rachel [name] Rachel [name] is a 40 year old right-handed woman with an underlying medical history of diabetes, vitamin D deficiency, hypertension, hyperlipidemia, recent UTI, and morbid obesity with a BMI of over 39, who Presents for follow-up consultation of her recurrent headaches and eye twitching.  She had been referred by her primary care physician and I first met her in January 2021.  She was advised to proceed with a brain MRI and sleep study testing and sleep at some risk factors for obstructive sleep apnea.  She had a brain MRI w/wo contrast on 10/15/19 and I reviewed Rachel Rachel [name] Rachel [name]: IMPRESSION:    MRI brain (with and without) demonstrating: - Absence of septum pellucidum with mono-ventricle variant. - Remainder of brain parenchyma is unremarkable.      She had a HST on 09/23/19, which Indicated near moderate obstructive sleep apnea with an AHI of 14.8, O2 nadir of 69%.  She was advised to start with AutoPap therapy.  Today, 02/17/2020: I reviewed her AutoPap compliance data from 01/14/2020 through 02/12/2020, which is a total of 30 days, during which [name] she used her machine 24 days with percent use days greater than 4 hours at 60%, indicating suboptimal compliance, average usage of 5 hours and 30 minutes for days on treatment, residual AHI at goal at 0.5/h, 95th percentile of pressure at 10.7 cm with a range of 6 to 12 cm with EPR.  Leak on Rachel Rachel [name] Rachel [name] with a 95th percentile at 1.5 L/min.  Her compliance in Rachel Rachel [name] beginning from her set up date of 10/21/2019 was much better, percent use days greater than 4 hours at 90% at Rachel Rachel [name] Rachel [name].  She reports that she her headaches improved.  In Rachel Rachel [name] Rachel [name] or [name] and a half she feels recurrence of left-sided headaches and also reports neck discomfort.  She reports trying gabapentin through ENT as well as muscle relaxers.  She has been using Excedrin as needed.  Gabapentin helped to some  degree.  She is not in favor of taking any daily medication quite yet.  Rachel Rachel [name] Rachel Rachel [name] Rachel [name] allergies, current medications, family history, past medical history, past social history, past surgical history and problem list were reviewed and updated as appropriate.   Previously:   09/03/19: (She) reports recurrent dull headaches, particularly in Rachel Rachel [name] Rachel [name].  She also reports twitching around her eyes and twitching and sometimes muscle jerking involuntarily in her upper and lower extremities.  No particular [name] of Rachel Rachel [name] Rachel [name], no obvious convulsive episodes, no prior history of migraines, has had some left ear fullness and pain.  She presented to Rachel Rachel [name] emergency room on 05/24/2019 with chills and had a fever.  She was treated for urinary tract infection and had also a Covid nasal swab done.  She reports that she had drainage through her left nostril with Rachel Rachel [name] test was administered afterwards.  She has been treated for a sinus infection and overall her ear symptoms and ear fullness, and nasal congestions have slowly improved.  For her headaches she gets some relief from taking over-Rachel Rachel [name]-counter ibuprofen.  She denies any significant photophobia or nausea or vomiting.  She has not had any one-sided weakness or numbness or tingling.  She has had intermittent tingling in Rachel Rachel [name] lower extremities at times.  She had some blood work through your office on 07/22/2019, we will request test Rachel [name]. You checked her vitamin D, CMP, lipid panel, A1c  and urine culture.  She also reports that she had additional more recent blood work and urine testing as she was found to have blood in Rachel Rachel [name] urine.   She went back to Rachel Rachel [name] emergency room on 06/01/2019 secondary to drainage from Rachel Rachel [name] nose and severe left ear pain.  She was treated for a ear infection with amoxicillin, prior to that she was treated with Keflex.  She reported that her symptoms had started after Rachel Rachel [name] Covid 19 test was administered and she was worried that something was  perforated as Rachel Rachel [name] test was administered.  She had a nasal speculum examination and was reassured, in particular since there was no blood and no obvious drainage was seen and she was reassured that no puncture had taken place.  I reviewed Rachel Rachel [name] emergency room records from 05/24/2019 as well as 06/01/2019. I reviewed your office note from 07/22/2019.  She has recently been seen by ENT for left-sided ear pain.  She was noted to have left middle ear effusion and was suspected to have a sinus infection versus URI and was treated with amoxicillin.  She had a maxillofacial CT without contrast on 07/11/2019 and I reviewed Rachel Rachel [name] Rachel [name]: IMPRESSION: No significant finding. Rachel Rachel [name] sinuses are clear except for small amount of fluid in a single posterior ethmoid air cell on Rachel Rachel [name] left.   She wears eyeglasses, she has not had an eye examination.  She feels that her eyes get twitchy.  She has had some involuntary twitching in her hands and legs, no pattern, does not happen every [name].  She snores, she does not sleep very well.  She is sleepy during Rachel Rachel [name] Rachel [name].  Epworth sleepiness score is 19 out of 24 today.  She lives alone, she is single.  She does not smoke or drink alcohol and does not drink caffeine daily.   09/05/2019: Addendum: I reviewed Rachel Rachel [name] Rachel [name] blood work from 09/03/2019: Urinalysis was unremarkable, CBC with differential showed hemoglobin of 11.9 and hematocrit of 36.2, both slightly reduced, CMP showed blood sugar of 148, BUN 11, creatinine 0.81, otherwise unremarkable findings, TSH was 2.55, vitamin D level was slightly below normal at 28.7, A1c was elevated at 7.6, this was on 08/05/2019.  Her lipid panel from 08/05/2019 showed total cholesterol of 164, triglycerides 106, LDL 95.  Her Past Medical History Is Significant For: Past Medical History:  Diagnosis Date  . Diabetes mellitus without complication ([place])    pre diabetes  . High triglycerides   . Hypertension   . Obesity   . Shingles   . Sleep apnea   .  Uterine fibroid     Her Past Surgical History Is Significant For: History reviewed. No pertinent surgical history.  Her Family History Is Significant For: Family History  Problem Relation Age of Onset  . Breast cancer Maternal Aunt   . Healthy Mother   . Pancreatic cancer Maternal Uncle   . Colon cancer Maternal Grandmother   . Diabetes Maternal Grandmother   . Hypertension Maternal Grandmother   . Heart disease Maternal Grandfather   . Heart attack Maternal Grandfather   . Healthy Father   . Hypertension Other     Her Social History Is Significant For: Social History   Socioeconomic History  . Marital status: Single    Spouse name: Not on file  . Number of children: Not on file  . Years of education: Not on file  . Highest education level: Not on file  Occupational History  . Not on file  Tobacco Use  .  Smoking status: Former Research scientist (life sciences)  . Smokeless tobacco: Never Used  Vaping Use  . Vaping Use: Never used  Substance and Sexual Activity  . Alcohol use: No  . Drug use: No  . Sexual activity: Not Currently    Birth control/protection: None  Other Topics Concern  . Not on file  Social History Narrative  . Not on file   Social Determinants of Health   Financial Resource Strain:   . Difficulty of Paying Living Expenses:   Food Insecurity:   . Worried About Charity fundraiser in Rachel Rachel [name] Last Year:   . Arboriculturist in Rachel Rachel [name] Last Year:   Transportation Needs:   . Film/video editor (Medical):   Marland Kitchen Lack of Transportation (Non-Medical):   Physical Activity:   . Days of Exercise per Week:   . Minutes of Exercise per Session:   Stress:   . Feeling of Stress :   Social Connections:   . Frequency of Communication with Friends and Family:   . Frequency of Social Gatherings with Friends and Family:   . Attends Religious Services:   . Active Member of Clubs or Organizations:   . Attends Archivist Meetings:   Marland Kitchen Marital Status:     Her Allergies Are:   Allergies  Allergen Reactions  . Crestor [Rosuvastatin Calcium] Swelling    Swelling in Rachel Rachel [name] legs   :   Her Current Medications Are:  Outpatient Encounter Medications as of 02/17/2020  Medication Sig  . acetaminophen (TYLENOL) 325 MG tablet Take 650 mg by mouth every 6 (six) hours as needed for moderate pain.  . cholecalciferol (VITAMIN D3) 25 MCG (1000 UT) tablet Take 1,000 Units by mouth daily.  . Elagolix Sodium (ORILISSA) 200 MG TABS Take 200 mg by mouth in Rachel Rachel [name] morning and at bedtime.  Marland Kitchen glipiZIDE (GLUCOTROL) 10 MG tablet Take 10 mg by mouth every morning.  . hydrocortisone (ANUSOL-HC) 2.5 % rectal cream Place 1 application rectally as needed for hemorrhoids.   . hydrOXYzine (ATARAX/VISTARIL) 10 MG tablet 1 tab qhs with itching.  Can take up to TID as needed. (Rachel [name] taking differently: Take 10 mg by mouth every 8 (eight) hours as needed for itching. )  . lisinopril (ZESTRIL) 10 MG tablet Take 10 mg by mouth daily.  . metFORMIN (GLUCOPHAGE) 500 MG tablet Take 500-1,000 mg by mouth See admin instructions. 1017m in Rachel Rachel [name] am and 500 mg in Rachel Rachel [name] pm  . omeprazole (PRILOSEC) 20 MG capsule Take 1 capsule (20 mg total) by mouth daily.  . ondansetron (ZOFRAN ODT) 4 MG disintegrating tablet Take 1 tablet (4 mg total) by mouth every 8 (eight) hours as needed for nausea or vomiting.  . [DISCONTINUED] omeprazole (PRILOSEC OTC) 20 MG tablet Take 20 mg by mouth daily.    No facility-administered encounter medications on file as of 02/17/2020.  :  Review of Systems:  Out of a complete 14 point review of systems, all are reviewed and negative with Rachel Rachel [name] exception of these symptoms as listed below: Review of Systems  Neurological:       Here for f/u on autopap. Pt reports h/a have not improved since being on Rachel Rachel [name] autopap.  Reports over Rachel Rachel [name] last [name] h/a has been present everyday. She would also like to discuss her recent MRI report.    Objective:  Neurological Exam  Physical Exam Physical  Examination:   Vitals:   02/17/20 0816  BP: 127/82  Pulse: 72  SpO2: 98%    General  Examination: Rachel Rachel [name] Rachel [name] is a very pleasant 39 y.o. female in no acute distress. She appears well-developed and well-nourished and well groomed.   HEENT: Normocephalic, atraumatic, pupils are equal, round and reactive to light, tracking good. No abnormal involuntary facial movements, no lip, neck or jaw tremor.  Airway examination reveals marked airway crowding secondary to tonsillar size of 2-3+, larger uvula, larger tongue.  Mallampati is class II.  Tongue protrudes centrally in palate elevates symmetrically, no carotid bruits.  Speech is clear without dysarthria, hypophonia or voice tremor. Face is symmetric with normal facial animation.  Chest: Clear to auscultation without wheezing, rhonchi or crackles noted.  Heart: S1+S2+0, regular and normal without murmurs, rubs or gallops noted.   Abdomen: Soft, non-tender and non-distended with normal bowel sounds appreciated on auscultation.  Extremities: There is no pitting edema in Rachel Rachel [name] distal lower extremities bilaterally.  Skin: Warm and dry without trophic changes noted.  Musculoskeletal: exam reveals No obvious joint swelling or joint deformities.   Neurologically:  Mental status: Rachel Rachel [name] Rachel [name] is awake, alert and oriented in all 4 spheres. Her immediate and remote memory, attention, language skills and fund of knowledge are appropriate. There is no evidence of aphasia, agnosia, apraxia or anomia. Speech is clear with normal prosody and enunciation. Thought process is linear. Mood is normal and affect is normal.  Cranial nerves II - XII are as described above under HEENT exam. In addition: shoulder shrug is normal with equal shoulder height noted. Motor exam: No new findings. No abnormal involuntary movements are seen, no obvious myoclonus, no athetoid movements, no dystonic posturing. Romberg is negative. Fine motor skills and coordination: grossly  intact.  Cerebellar testing: No dysmetria or intention tremor. There is no truncal or gait ataxia.  Sensory exam: intact to light touch.  Gait, station and balance: She stands easily. No veering to one [name] is noted. No leaning to one [name] is noted. Posture is age-appropriate and stance is narrow based. Gait shows normal stride length and normal pace. No problems turning are noted. Tandem walk is unremarkable.            Assessment and Plan:   In summary, Tykisha Areola is a very pleasant 39 year old female  with an underlying medical history of diabetes, vitamin D deficiency, hypertension, hyperlipidemia, recent UTI, and morbid obesity with a BMI of over 40, who presents for follow-up consultation of her recurrent headaches which are not in keeping with migrainous headaches.  She had an interim eye examination with optometry with benign Rachel [name] per her report.  Brain MRI was nonrevealing, had a incidental finding of a monoventricle.  Sleep testing with a home sleep test in February showed moderate or near moderate obstructive sleep apnea with significant desaturations, as low as 69%.  She has been on AutoPap therapy.  She reports a recurrence of her left-sided headaches in Rachel Rachel [name] Rachel [name] or [name] and a half.  Interestingly she reports improvement that correlated with her consistent usage of AutoPap therapy.  She is encouraged to continue with AutoPap and not skip any nights and try to be consistent with usage.  I believe she has benefit from this long-term and she is advised of Rachel Rachel [name] adverse effects of untreated obstructive sleep apnea.  Since she had desaturations as low as 69% I would like to proceed with an overnight pulse oximetry test while she is on AutoPap therapy.  Her DME company should reach out to her for this test and we will call her with Rachel Rachel [name] Rachel [name].  I will place an order for this.  She reports neck pain and also pain at Rachel Rachel [name] base of her [name] on Rachel Rachel [name] left [name].  Sometimes she has an aching and  sometimes it seems to shoot upwards and forwards from Rachel Rachel [name] neck area.  She has tried gabapentin in Rachel Rachel [name] past through ENT and also has been on muscle relaxers.  She is not in favor of trying any medication quite yet.  She is advised to proceed with a cervical spine MRI.  We will call her to schedule his hopefully soon and call her with Rachel Rachel [name] Rachel [name] as well.  She is advised to follow-up in Rachel Rachel [name] clinic in about 3 months, sooner if needed.  We may consider amitriptyline next.  I answered all her questions today and she was in agreement with Rachel Rachel [name] plan.   I spent 30 minutes in total face-to-face [name] and in reviewing records during pre-charting, more than 50% of which was spent in counseling and coordination of care, reviewing test Rachel [name], reviewing medications and treatment regimen and/or in discussing or reviewing Rachel Rachel [name] diagnosis of headaches, OSA, Rachel Rachel [name] prognosis and treatment options. Pertinent laboratory and imaging test Rachel [name] that were available during this visit with Rachel Rachel [name] Rachel [name] were reviewed by me and considered in my medical decision making (see chart for details).

## 2020-02-17 NOTE — Patient Instructions (Signed)
Please continue using your autoPAP regularly. While your insurance requires that you use PAP at least 4 hours each night on 70% of the nights, I recommend, that you not skip any nights and use it throughout the night if you can. Getting used to PAP and staying with the treatment long term does take time and patience and discipline. Untreated obstructive sleep apnea when it is moderate to severe can have an adverse impact on cardiovascular health and raise her risk for heart disease, arrhythmias, hypertension, congestive heart failure, stroke and diabetes. Untreated obstructive sleep apnea causes sleep disruption, nonrestorative sleep, and sleep deprivation. This can have an impact on your day to day functioning and cause daytime sleepiness and impairment of cognitive function, memory loss, mood disturbance, and problems focussing. Using PAP regularly can improve these symptoms.  You did a very good job using your AutoPap in the very beginning.  Please try to get back on using your machine consistently.  It may help with headaches.  As discussed, we will proceed with a neck MRI to rule out degenerative changes and a structural cause for your recurrent headaches and we will call you with the results.  As discussed, we will do an overnight oxygen level test, called ONO, and your DME company will call and set this up for one night, while you also use your autoPAP as usual. We will call you with the results. This is to make sure that your oxygen levels stay in the 90s, while you are treated with autoPAP for your OSA. Remember, your oxygen levels dropped into the 70s (lowest saturation was 69%) during the home sleep test.

## 2020-02-17 NOTE — Telephone Encounter (Signed)
[employer] order sent to [facility]. They will obtain the auth and reach out to the patient to schedule.  

## 2020-02-18 NOTE — Telephone Encounter (Signed)
Novella Rob: [id number] (exp. 02/18/20 to 05/18/20

## 2020-03-06 ENCOUNTER — Encounter: Payer: Self-pay | Admitting: Neurology

## 2020-03-09 ENCOUNTER — Telehealth: Payer: Self-pay | Admitting: Neurology

## 2020-03-09 NOTE — Telephone Encounter (Signed)
I reached out to the pt and advised of ONO result. Pt verbalized understanding.

## 2020-03-09 NOTE — Telephone Encounter (Signed)
Please call patient regarding the recent pulse oximetry test from 03/06/2020.  She was on room air on AutoPap therapy at the time, total duration of test time was 5 hours and 29 minutes, average oxygen saturation 97%, nadir was 91%.  Please call patient and advise her that her recent oxygen monitor test did not show any significant desaturations thankfully.  Please encourage her to be fully compliant with her AutoPap therapy and follow-up as scheduled.

## 2020-03-24 ENCOUNTER — Encounter: Payer: Self-pay | Admitting: Obstetrics & Gynecology

## 2020-03-27 ENCOUNTER — Ambulatory Visit
Admission: RE | Admit: 2020-03-27 | Discharge: 2020-03-27 | Disposition: A | Discharge status: Home / Self Care | Payer: Managed Care, Other (non HMO) | Source: Ambulatory Visit | Attending: Neurology | Admitting: Neurology

## 2020-03-27 ENCOUNTER — Other Ambulatory Visit: Payer: Self-pay

## 2020-03-27 DIAGNOSIS — M542 Cervicalgia: Secondary | ICD-10-CM

## 2020-03-27 DIAGNOSIS — G4733 Obstructive sleep apnea (adult) (pediatric): Secondary | ICD-10-CM

## 2020-03-27 DIAGNOSIS — G4489 Other headache syndrome: Secondary | ICD-10-CM

## 2020-03-27 DIAGNOSIS — Z9989 Dependence on other enabling machines and devices: Secondary | ICD-10-CM

## 2020-03-27 DIAGNOSIS — R519 Headache, unspecified: Secondary | ICD-10-CM

## 2020-03-27 DIAGNOSIS — G4734 Idiopathic sleep related nonobstructive alveolar hypoventilation: Secondary | ICD-10-CM

## 2020-03-27 MED ORDER — GADOBENATE DIMEGLUMINE 529 MG/ML IV SOLN
20.0000 mL | Freq: Once | INTRAVENOUS | Status: AC | PRN
  Administered 2020-03-27: 20 mL via INTRAVENOUS

## 2020-05-25 ENCOUNTER — Encounter: Payer: Self-pay | Admitting: Neurology

## 2020-05-25 ENCOUNTER — Ambulatory Visit: Payer: Managed Care, Other (non HMO) | Admitting: Neurology

## 2020-09-21 ENCOUNTER — Other Ambulatory Visit: Payer: Self-pay | Admitting: Obstetrics & Gynecology

## 2020-09-21 ENCOUNTER — Other Ambulatory Visit (HOSPITAL_COMMUNITY)
Admission: RE | Admit: 2020-09-21 | Discharge: 2020-09-21 | Disposition: A | Discharge status: Home / Self Care | Payer: Managed Care, Other (non HMO) | Source: Ambulatory Visit | Attending: Obstetrics & Gynecology | Admitting: Obstetrics & Gynecology

## 2020-09-21 ENCOUNTER — Encounter: Payer: Self-pay | Admitting: Obstetrics & Gynecology

## 2020-09-21 ENCOUNTER — Ambulatory Visit: Payer: Self-pay

## 2020-09-21 ENCOUNTER — Other Ambulatory Visit: Payer: Self-pay

## 2020-09-21 ENCOUNTER — Ambulatory Visit (INDEPENDENT_AMBULATORY_CARE_PROVIDER_SITE_OTHER): Payer: Managed Care, Other (non HMO) | Admitting: Obstetrics & Gynecology

## 2020-09-21 VITALS — BP 138/87 | HR 83 | Ht 65.0 in | Wt 246.0 lb

## 2020-09-21 DIAGNOSIS — N6489 Other specified disorders of breast: Secondary | ICD-10-CM

## 2020-09-21 DIAGNOSIS — K760 Fatty (change of) liver, not elsewhere classified: Secondary | ICD-10-CM | POA: Insufficient documentation

## 2020-09-21 DIAGNOSIS — D252 Subserosal leiomyoma of uterus: Secondary | ICD-10-CM

## 2020-09-21 DIAGNOSIS — E7800 Pure hypercholesterolemia, unspecified: Secondary | ICD-10-CM | POA: Insufficient documentation

## 2020-09-21 DIAGNOSIS — Z113 Encounter for screening for infections with a predominantly sexual mode of transmission: Secondary | ICD-10-CM | POA: Diagnosis not present

## 2020-09-21 DIAGNOSIS — E1165 Type 2 diabetes mellitus with hyperglycemia: Secondary | ICD-10-CM | POA: Insufficient documentation

## 2020-09-21 DIAGNOSIS — D251 Intramural leiomyoma of uterus: Secondary | ICD-10-CM | POA: Diagnosis not present

## 2020-09-21 DIAGNOSIS — Z01419 Encounter for gynecological examination (general) (routine) without abnormal findings: Secondary | ICD-10-CM

## 2020-09-21 DIAGNOSIS — N920 Excessive and frequent menstruation with regular cycle: Secondary | ICD-10-CM

## 2020-09-21 NOTE — Patient Instructions (Signed)
[facility] Address: [address] [place], [place] [address] Phone: [phone]

## 2020-09-21 NOTE — Progress Notes (Signed)
40 y.o. G0P0000 Single Black or [place] American female here for annual exam.  She had myomectomy with mini-laparotomy with Dr. Alen Bleacher 04/22/2020 at [facility].  Operative report reviewed.  758gram of fibroid tissue removed.  Reports pain is better but not resolved.  Cycles are better but still have and last 7 days.  Her flow is still heavy the entire time.  Basically has been released from Dr. Alen Bleacher.  We discussed follow up ultrasound today.  She does not have pain with her cycle and constipation is better.  Overall, she is better but bleeding has not improved as much as we both thought it would.  POPs and progesterone IUD discussed as possible treatments for bleeding as well.   Has some skin rashes and bumps that she's taken pictures of and would like me to look at today.  Also reports a vulvar bump she wants me to check and she is having some intermittent vaginal pain.  Patient's last menstrual period was 09/19/2020 (exact date).          Sexually active: Yes.     The current method of family planning is abstinence.    Smoker:  Former smoker  Health Maintenance: Pap:  09/16/2019, neg HR HPV, negative STD History of abnormal Pap:  no MMG:  Needs follow up scheduled from 04/2018 imaging TDaP:  04/2017   reports that she has quit smoking. She has never used smokeless tobacco. She reports that she does not drink alcohol and does not use drugs.  Past Medical History:  Diagnosis Date  . Diabetes mellitus without complication ([place])    pre diabetes  . High triglycerides   . Hypertension   . Obesity   . Shingles   . Sleep apnea   . Uterine fibroid     Past Surgical History:  Procedure Laterality Date  . MYOMECTOMY      Current Outpatient Medications  Medication Sig Dispense Refill  . acetaminophen (TYLENOL) 325 MG tablet Take 650 mg by mouth every 6 (six) hours as needed for moderate pain.    . cholecalciferol (VITAMIN D3) 25 MCG (1000 UT) tablet Take 1,000 Units by mouth daily.    .  hydrocortisone (ANUSOL-HC) 2.5 % rectal cream Place 1 application rectally as needed for hemorrhoids.     . hydrOXYzine (ATARAX/VISTARIL) 10 MG tablet 1 tab qhs with itching.  Can take up to TID as needed. (Patient taking differently: Take 10 mg by mouth every 8 (eight) hours as needed for itching.) 30 tablet 0  . lisinopril (ZESTRIL) 10 MG tablet Take 10 mg by mouth daily.    . metFORMIN (GLUCOPHAGE) 500 MG tablet Take 500-1,000 mg by mouth See admin instructions. 1000mg  in the am and 500 mg in the pm    . ondansetron (ZOFRAN ODT) 4 MG disintegrating tablet Take 1 tablet (4 mg total) by mouth every 8 (eight) hours as needed for nausea or vomiting. 20 tablet 0  . Elagolix Sodium (ORILISSA) 200 MG TABS Take 200 mg by mouth in the morning and at bedtime. (Patient not taking: Reported on 09/21/2020) 60 tablet 0  . glipiZIDE (GLUCOTROL) 10 MG tablet Take 10 mg by mouth every morning. (Patient not taking: Reported on 09/21/2020)    . omeprazole (PRILOSEC) 20 MG capsule Take 1 capsule (20 mg total) by mouth daily. (Patient not taking: Reported on 09/21/2020) 30 capsule 0   No current facility-administered medications for this visit.    Family History  Problem Relation Age of Onset  . Breast  cancer Maternal Aunt   . Healthy Mother   . Pancreatic cancer Maternal Uncle   . Colon cancer Maternal Grandmother   . Diabetes Maternal Grandmother   . Hypertension Maternal Grandmother   . Heart disease Maternal Grandfather   . Heart attack Maternal Grandfather   . Healthy Father   . Hypertension Other     Review of Systems  Exam:   BP 138/87   Pulse 83   Ht 5\' 5"  (1.651 m)   Wt 246 lb (111.6 kg)   LMP 09/19/2020 (Exact Date)   BMI 40.94 kg/m   Height: 5\' 5"  (165.1 cm)  General appearance: alert, cooperative and appears stated age Head: Normocephalic, without obvious abnormality, atraumatic Neck: no adenopathy, supple, symmetrical, trachea midline and thyroid normal to inspection and  palpation Lungs: clear to auscultation bilaterally Breasts: normal appearance, no masses or tenderness Heart: regular rate and rhythm Abdomen: soft, non-tender; bowel sounds normal; no masses,  no organomegaly Extremities: extremities normal, atraumatic, no cyanosis or edema Skin: Skin color, texture, turgor normal. No rashes or lesions Lymph nodes: Cervical, supraclavicular, and axillary nodes normal. No abnormal inguinal nodes palpated Neurologic: Grossly normal   Pelvic: External genitalia:  20mm nodule within labia majora about midregion, c/w lipoma, no visible skin changes              Urethra:  normal appearing urethra with no masses, tenderness or lesions              Bartholins and Skenes: normal                 Vagina: normal appearing vagina with normal color and discharge, no lesions              Cervix: no lesions              Pap taken: No. Bimanual Exam:  Uterus:  normal size, contour, position, consistency, mobility, non-tender              Adnexa: normal adnexa and no mass, fullness, tenderness               Rectovaginal: Confirms               Anus:  normal sphincter tone, no lesions  Chaperone, Crosby Oyster, RN, was present for exam.  Assessment/Plan: 1. Well woman exam with routine gynecological exam - pap with neg HR HPV 08/2019.  Not indicated today. - blood work done with work provider - vaccines reviewed.  Declines flu and covid vaccination  2. Menorrhagia with regular cycle - US PELVIC COMPLETE WITH TRANSVAGINAL; Future  3. Intramural and subserous leiomyoma of uterus, s/p myomectomy 9/021 at [facility]  4. Screening examination for STD (sexually transmitted disease) - HEP, RPR, HIV Panel - Hepatitis C antibody - Cervicovaginal ancillary only - HSV(herpes simplex vrs) 1+2 ab-IgG  5. Breast asymmetry - MM Digital Diagnostic Bilat; Future  6. Vulvar mass on right labia majora c/w lipoma - will monitor.  Pt knows to call if enlarges

## 2020-09-22 LAB — CERVICOVAGINAL ANCILLARY ONLY
Bacterial Vaginitis (gardnerella): NEGATIVE
Candida Glabrata: NEGATIVE
Candida Vaginitis: NEGATIVE
Chlamydia: NEGATIVE
Comment: NEGATIVE
Comment: NEGATIVE
Comment: NEGATIVE
Comment: NEGATIVE
Comment: NEGATIVE
Comment: NORMAL
Neisseria Gonorrhea: NEGATIVE
Trichomonas: NEGATIVE

## 2020-09-22 LAB — HEP, RPR, HIV PANEL
HIV Screen 4th Generation wRfx: NONREACTIVE
Hepatitis B Surface Ag: NEGATIVE
RPR Ser Ql: NONREACTIVE

## 2020-09-22 LAB — HEPATITIS C ANTIBODY: Hep C Virus Ab: <0.1 s/co ratio (ref 0.0–0.9)

## 2020-09-22 LAB — HSV(HERPES SIMPLEX VRS) I + II AB-IGG
HSV 1 Glycoprotein G Ab, IgG: >62.2 index — ABNORMAL HIGH (ref 0.00–0.90)
HSV 2 IgG, Type Spec: <0.91 index (ref 0.00–0.90)

## 2020-10-05 ENCOUNTER — Ambulatory Visit
Admission: RE | Admit: 2020-10-05 | Discharge: 2020-10-05 | Disposition: A | Discharge status: Home / Self Care | Payer: Managed Care, Other (non HMO) | Source: Ambulatory Visit | Attending: Obstetrics and Gynecology | Admitting: Obstetrics and Gynecology

## 2020-10-05 ENCOUNTER — Other Ambulatory Visit: Payer: Self-pay

## 2020-10-05 DIAGNOSIS — D251 Intramural leiomyoma of uterus: Secondary | ICD-10-CM | POA: Diagnosis present

## 2020-10-05 DIAGNOSIS — D252 Subserosal leiomyoma of uterus: Secondary | ICD-10-CM | POA: Diagnosis present

## 2020-10-05 DIAGNOSIS — N920 Excessive and frequent menstruation with regular cycle: Secondary | ICD-10-CM | POA: Diagnosis present

## 2020-10-08 ENCOUNTER — Encounter (HOSPITAL_BASED_OUTPATIENT_CLINIC_OR_DEPARTMENT_OTHER): Payer: Self-pay

## 2020-10-23 IMAGING — DX DG CHEST 1V PORT
1 series · 1 of 1 positions shown · non-contrast
Comparison: None.

CLINICAL DATA: Fever

EXAM:
PORTABLE CHEST 1 VIEW

[chest ap]
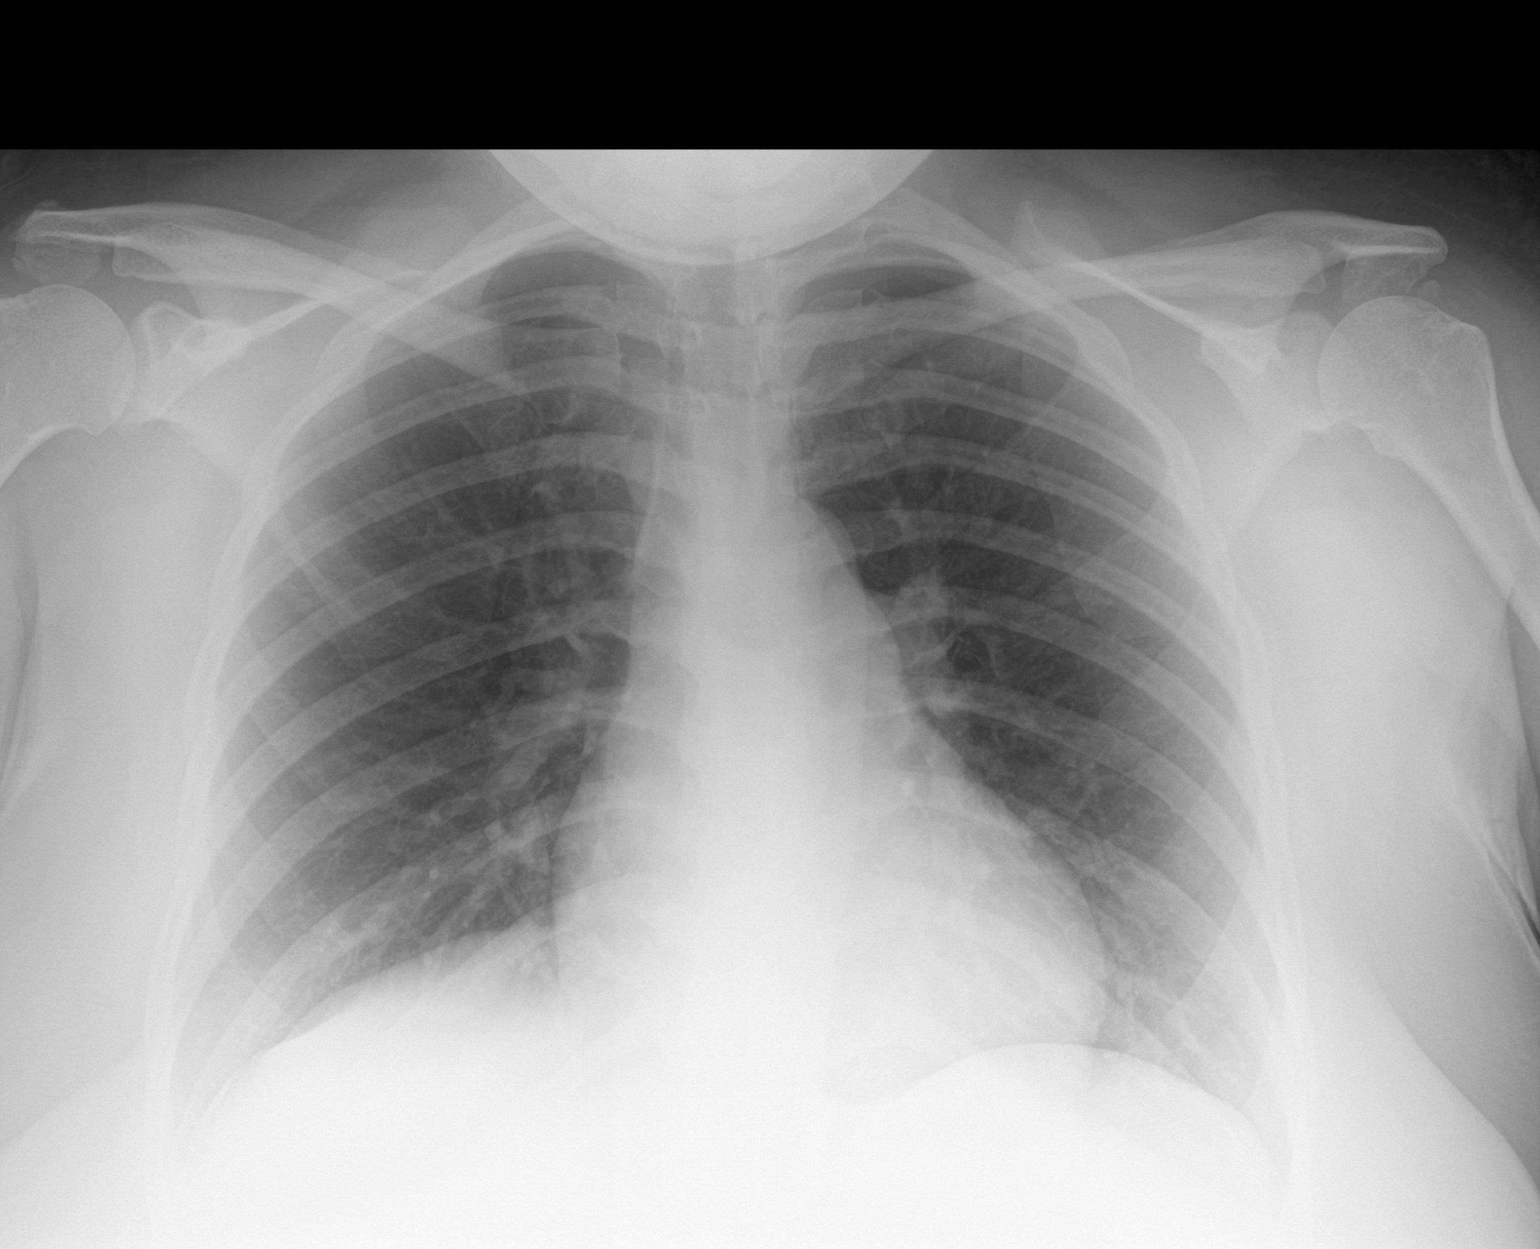

[1 of 1 positions shown; findings below may reference images not displayed]

FINDINGS: Lungs are clear. Heart size and pulmonary vascularity are normal. No
adenopathy. No bone lesions.
IMPRESSION: No edema or consolidation.

## 2020-11-02 ENCOUNTER — Ambulatory Visit
Admission: RE | Admit: 2020-11-02 | Discharge: 2020-11-02 | Disposition: A | Discharge status: Home / Self Care | Payer: Managed Care, Other (non HMO) | Source: Ambulatory Visit | Attending: Obstetrics & Gynecology | Admitting: Obstetrics & Gynecology

## 2020-11-02 ENCOUNTER — Other Ambulatory Visit: Payer: Self-pay

## 2020-11-02 ENCOUNTER — Ambulatory Visit: Payer: Managed Care, Other (non HMO)

## 2020-11-02 DIAGNOSIS — N6489 Other specified disorders of breast: Secondary | ICD-10-CM

## 2020-11-16 ENCOUNTER — Encounter (HOSPITAL_BASED_OUTPATIENT_CLINIC_OR_DEPARTMENT_OTHER): Payer: Self-pay | Admitting: Obstetrics & Gynecology

## 2020-11-16 ENCOUNTER — Ambulatory Visit (HOSPITAL_BASED_OUTPATIENT_CLINIC_OR_DEPARTMENT_OTHER): Payer: Managed Care, Other (non HMO) | Admitting: Obstetrics & Gynecology

## 2020-11-16 ENCOUNTER — Other Ambulatory Visit: Payer: Self-pay

## 2020-11-16 VITALS — BP 143/90 | HR 88 | Ht 64.0 in | Wt 244.0 lb

## 2020-11-16 DIAGNOSIS — Z23 Encounter for immunization: Secondary | ICD-10-CM

## 2020-11-16 DIAGNOSIS — L089 Local infection of the skin and subcutaneous tissue, unspecified: Secondary | ICD-10-CM | POA: Diagnosis not present

## 2020-11-16 DIAGNOSIS — N920 Excessive and frequent menstruation with regular cycle: Secondary | ICD-10-CM | POA: Diagnosis not present

## 2020-11-16 DIAGNOSIS — D251 Intramural leiomyoma of uterus: Secondary | ICD-10-CM | POA: Diagnosis not present

## 2020-11-16 DIAGNOSIS — M6289 Other specified disorders of muscle: Secondary | ICD-10-CM | POA: Diagnosis not present

## 2020-11-16 NOTE — Progress Notes (Signed)
GYNECOLOGY  VISIT  CC:   Pelvic pain  HPI: 40 y.o. G0P0000 Single Black or [place] American female here for complaint of pelvic pain that has worsened over the past few months.  Pelvic is worse when she starts her cycle.  Pain is in the vagina and also feels like it is in the rectum.  She does not have pain with bowel movements.  Denies vaginal discharge.  Not currently SA.  Had STD testing in January.  Continues to be concerned about superficial skin infections along legs that are scarring and similar occurrence on mons pubis x 2.  She is using dial antibacterial soap.  States she's tried to call [place] dermatology several times but cannot get anyone on the phone.  I called [place] derm while she was in the office today and gave her the phone to schedule appt.    She has considered the HPV vaccination and desires to start today.  Also questions if there are any other tests that should be done for her pelvic pain.  She did have a colonoscopy with Dr. Benson Norway.  I do not have copies of this.  Will have her sign release for this today so I can be sure this was negative and when follow up was recommended.  GYNECOLOGIC HISTORY: No LMP recorded. Contraception: not SA  Patient Active Problem List   Diagnosis Date Noted  . Fatty liver 09/21/2020  . Hypercholesterolemia 09/21/2020  . Hyperglycemia due to type 2 diabetes mellitus ([place]) 09/21/2020  . Obesity 09/16/2019  . Deviated nasal septum 08/01/2019  . Hypertrophy of inferior nasal turbinate 08/01/2019  . Diabetes mellitus type II, controlled ([place]) 05/20/2014    Past Medical History:  Diagnosis Date  . Diabetes mellitus without complication ([place])    pre diabetes  . High triglycerides   . Hypertension   . Obesity   . Shingles   . Sleep apnea   . Uterine fibroid     Past Surgical History:  Procedure Laterality Date  . MYOMECTOMY      MEDS:   Current Outpatient Medications on File Prior to Visit  Medication Sig Dispense Refill  .  acetaminophen (TYLENOL) 325 MG tablet Take 650 mg by mouth every 6 (six) hours as needed for moderate pain.    . cholecalciferol (VITAMIN D3) 25 MCG (1000 UT) tablet Take 1,000 Units by mouth daily.    Marland Kitchen lisinopril (ZESTRIL) 10 MG tablet Take 10 mg by mouth daily.    . metFORMIN (GLUCOPHAGE) 500 MG tablet Take 500-1,000 mg by mouth See admin instructions. 1000mg  in the am and 500 mg in the pm    . omeprazole (PRILOSEC) 20 MG capsule Take 1 capsule (20 mg total) by mouth daily. 30 capsule 0  . hydrocortisone (ANUSOL-HC) 2.5 % rectal cream Place 1 application rectally as needed for hemorrhoids.  (Patient not taking: Reported on 11/16/2020)    . hydrOXYzine (ATARAX/VISTARIL) 10 MG tablet 1 tab qhs with itching.  Can take up to TID as needed. (Patient not taking: Reported on 11/16/2020) 30 tablet 0  . ondansetron (ZOFRAN ODT) 4 MG disintegrating tablet Take 1 tablet (4 mg total) by mouth every 8 (eight) hours as needed for nausea or vomiting. (Patient not taking: Reported on 11/16/2020) 20 tablet 0  . [DISCONTINUED] omeprazole (PRILOSEC OTC) 20 MG tablet Take 20 mg by mouth daily.      No current facility-administered medications on file prior to visit.    ALLERGIES: Crestor [rosuvastatin calcium]  Family History  Problem  Relation Age of Onset  . Breast cancer Maternal Aunt   . Healthy Mother   . Pancreatic cancer Maternal Uncle   . Colon cancer Maternal Grandmother   . Diabetes Maternal Grandmother   . Hypertension Maternal Grandmother   . Heart disease Maternal Grandfather   . Heart attack Maternal Grandfather   . Healthy Father   . Hypertension Other     SH:  Single, non smoker  Review of Systems  Gastrointestinal: Positive for rectal pain. Negative for abdominal distention, anal bleeding, blood in stool and constipation.  Genitourinary: Positive for dysuria and pelvic pain. Negative for frequency, genital sores, menstrual problem and urgency.    PHYSICAL EXAMINATION:    BP (!)  143/90   Pulse 88   Ht 5\' 4"  (1.626 m)   Wt 244 lb (110.7 kg)   BMI 41.88 kg/m     General appearance: alert, cooperative and appears stated age Abdomen: soft, non-tender; bowel sounds normal; no masses,  no organomegaly Lymph:  no inguinal LAD noted  Pelvic: External genitalia:  no lesions              Urethra:  normal appearing urethra with no masses, tenderness or lesions              Bartholins and Skenes: normal                 Vagina: normal appearing vagina with normal color and discharge, no lesions              Cervix: no lesions              Bimanual Exam:  Uterus:  enlarged, 12 weeks size, enlarged on pt's left more than right, non tender, mobile, pelvic floor very tender to palpation              Adnexa: no mass, fullness, tenderness              Rectovaginal: Yes.  .  Confirms.              Anus:  normal sphincter tone, no lesions  Chaperone, Prince Rome, CMA, was present for exam.  Assessment/Plan: 1. Pelvic floor dysfunction - pt aware this is the only abnormal finding noted on exam today except for fibroid uterus and she had PUS within the last three months.  At this time, I do tno recommend any additional imaging.  However, if PT does not help, then I think should consider pelvic MRI. - Ambulatory referral to Physical Therapy  2. Need for prophylactic vaccination against human papillomavirus (HPV) types 6, 11, 16, and 18 - Garsidil 9 x 1 given today, repeat 2 and 6 months  3. Menorrhagia with regular cycle - stable  4. Intramural uterine fibroid - had PUS 10/05/2020  5.  Skin infection - called dermatology for pt while she was here and she made appt  6.  H/o rectal bleeding - release for colonoscopy and and notes from Dr. Benson Norway signed today.  Total time with pt for visit: 38 minutes

## 2020-11-17 ENCOUNTER — Ambulatory Visit (HOSPITAL_COMMUNITY)
Admission: EM | Admit: 2020-11-17 | Discharge: 2020-11-17 | Disposition: A | Discharge status: Home / Self Care | Payer: Managed Care, Other (non HMO) | Attending: Emergency Medicine | Admitting: Emergency Medicine

## 2020-11-17 ENCOUNTER — Encounter (HOSPITAL_COMMUNITY): Payer: Self-pay | Admitting: Emergency Medicine

## 2020-11-17 ENCOUNTER — Other Ambulatory Visit: Payer: Self-pay

## 2020-11-17 DIAGNOSIS — L739 Follicular disorder, unspecified: Secondary | ICD-10-CM | POA: Diagnosis not present

## 2020-11-17 DIAGNOSIS — R1031 Right lower quadrant pain: Secondary | ICD-10-CM

## 2020-11-17 MED ORDER — DOXYCYCLINE HYCLATE 100 MG PO CAPS: 100.0000 mg | ORAL_CAPSULE | Freq: Two times a day (BID) | ORAL | 0 refills | Status: AC

## 2020-11-17 NOTE — ED Provider Notes (Signed)
[place]    CSN: [id number] Arrival date & time: 11/17/20  1906      History   Chief Complaint Chief Complaint  Patient presents with  . Abscess    HPI Rachel [name] is a 40 y.o. female.   Rachel [name] is a 40 y.o. here with chief complaint of right groin pain.  Patient reports noticing swelling and pain to right groin this morning.  Reports being evaluated at gynecologist yesterday and had HPV vaccine.  Denies any drainage or discharge.  Denies any pelvic pain, urinary frequency, urgency, vaginal pain, vaginal bleeding, or vaginal discharge. Denies any specific alleviating or aggravating factors.  Denies any fevers, chest pain, shortness of breath, N/V/D, numbness, tingling, weakness, abdominal pain, or headaches.   ROS: As per HPI, all other pertinent ROS negative    The history is provided by the patient.  Abscess   Past Medical History:  Diagnosis Date  . Diabetes mellitus without complication ([place])    pre diabetes  . High triglycerides   . Hypertension   . Obesity   . Shingles   . Sleep apnea   . Uterine fibroid     Patient Active Problem List   Diagnosis Date Noted  . Fatty liver 09/21/2020  . Hypercholesterolemia 09/21/2020  . Hyperglycemia due to type 2 diabetes mellitus ([place]) 09/21/2020  . Obesity 09/16/2019  . Deviated nasal septum 08/01/2019  . Hypertrophy of inferior nasal turbinate 08/01/2019  . Diabetes mellitus type II, controlled ([place]) 05/20/2014    Past Surgical History:  Procedure Laterality Date  . MYOMECTOMY      OB History    Gravida  0   Para  0   Term  0   Preterm  0   AB  0   Living  0     SAB  0   IAB  0   Ectopic  0   Multiple  0   Live Births  0            Home Medications    Prior to Admission medications   Medication Sig Start Date End Date Taking? Authorizing Provider  doxycycline (VIBRAMYCIN) 100 MG capsule Take 1 capsule (100 mg total) by mouth 2 (two) times daily for 7 days.  11/17/20 11/24/20 Yes Pearson Forster, NP  acetaminophen (TYLENOL) 325 MG tablet Take 650 mg by mouth every 6 (six) hours as needed for moderate pain.    [provider]  cholecalciferol (VITAMIN D3) 25 MCG (1000 UT) tablet Take 1,000 Units by mouth daily.    [provider]  hydrocortisone (ANUSOL-HC) 2.5 % rectal cream Place 1 application rectally as needed for hemorrhoids.  Patient not taking: Reported on 11/16/2020 09/20/19   [provider]  hydrOXYzine (ATARAX/VISTARIL) 10 MG tablet 1 tab qhs with itching.  Can take up to TID as needed. Patient not taking: Reported on 11/16/2020 09/26/19   Megan Salon, MD  lisinopril (ZESTRIL) 10 MG tablet Take 10 mg by mouth daily. 04/27/19   [provider]  metFORMIN (GLUCOPHAGE) 500 MG tablet Take 500-1,000 mg by mouth See admin instructions. 1000mg  in the am and 500 mg in the pm 05/05/14   [provider]  omeprazole (PRILOSEC) 20 MG capsule Take 1 capsule (20 mg total) by mouth daily. 12/28/19   Molpus, John, MD  ondansetron (ZOFRAN ODT) 4 MG disintegrating tablet Take 1 tablet (4 mg total) by mouth every 8 (eight) hours as needed for nausea or  vomiting. Patient not taking: Reported on 11/16/2020 11/25/19   Lucrezia Starch, MD  omeprazole (PRILOSEC OTC) 20 MG tablet Take 20 mg by mouth daily.  05/20/14 12/28/19  [provider]    Family History Family History  Problem Relation Age of Onset  . Breast cancer Maternal Aunt   . Healthy Mother   . Pancreatic cancer Maternal Uncle   . Colon cancer Maternal Grandmother   . Diabetes Maternal Grandmother   . Hypertension Maternal Grandmother   . Heart disease Maternal Grandfather   . Heart attack Maternal Grandfather   . Healthy Father   . Hypertension Other     Social History Social History   Tobacco Use  . Smoking status: Former Research scientist (life sciences)  . Smokeless tobacco: Never Used  Vaping Use  . Vaping Use: Never used  Substance Use Topics  . Alcohol use: No   . Drug use: No     Allergies   Crestor [rosuvastatin calcium]   Review of Systems Review of Systems   Physical Exam Triage Vital Signs ED Triage Vitals  Enc Vitals Group     BP 11/17/20 1929 (!) 146/85     Pulse Rate 11/17/20 1929 84     Resp 11/17/20 1929 18     Temp 11/17/20 1929 98.6 F (37 C)     Temp Source 11/17/20 1929 Oral     SpO2 11/17/20 1929 98 %     Weight --      Height --      Head Circumference --      Peak Flow --      Pain Score 11/17/20 1927 7     Pain Loc --      Pain Edu? --      Excl. in [place]? --    No data found.  Updated Vital Signs BP (!) 146/85 (BP Location: Right Arm)   Pulse 84   Temp 98.6 F (37 C) (Oral)   Resp 18   LMP 10/31/2020   SpO2 98%   Visual Acuity Right Eye Distance:   Left Eye Distance:   Bilateral Distance:    Right Eye Near:   Left Eye Near:    Bilateral Near:     Physical Exam Vitals and nursing note reviewed.  Constitutional:      General: She is not in acute distress.    Appearance: Normal appearance. She is not ill-appearing, toxic-appearing or diaphoretic.  HENT:     Head: Normocephalic and atraumatic.  Eyes:     Conjunctiva/sclera: Conjunctivae normal.  Cardiovascular:     Rate and Rhythm: Normal rate.     Pulses: Normal pulses.  Pulmonary:     Effort: Pulmonary effort is normal.  Abdominal:     General: Abdomen is flat.  Musculoskeletal:        General: Normal range of motion.     Cervical back: Normal range of motion.  Skin:    General: Skin is warm and dry.     Findings: Abscess (small 1-2 cm area of induration to right groin with no fluctuance, tender to palpation) present.  Neurological:     General: No focal deficit present.     Mental Status: She is alert and oriented to person, place, and time.  Psychiatric:        Mood and Affect: Mood normal.      UC Treatments / Results  Labs (all labs ordered are listed, but only abnormal results are displayed) Labs Reviewed - No data  to  display  EKG   Radiology No results found.  Procedures Procedures (including critical care time)  Medications Ordered in UC Medications - No data to display  Initial Impression / Assessment and Plan / UC Course  I have reviewed the triage vital signs and the nursing notes.  Pertinent labs & imaging results that were available during my care of the patient were reviewed by me and considered in my medical decision making (see chart for details).     Folliculitis Right inguinal pain Assessment negative for red flags or concerns.  Doxy BIDx7 days Apply warm compress to area several times a day  Ibuprofen PRN Follow up with PCP   Final Clinical Impressions(s) / UC Diagnoses   Final diagnoses:  Folliculitis  Right inguinal pain     Discharge Instructions     Take the doxycycline twice a day for the next 7 days.  Apply a warm compress to the area several times a day.    You can take Ibuprofen as needed for pain relief.    Follow up with your Primary Care Provider if your symptoms do not resolve or improve in the next 3-5 days.     ED Prescriptions    Medication Sig Dispense Auth. Provider   doxycycline (VIBRAMYCIN) 100 MG capsule Take 1 capsule (100 mg total) by mouth 2 (two) times daily for 7 days. 14 capsule Pearson Forster, NP     PDMP not reviewed this encounter.   Pearson Forster, NP 11/17/20 2011

## 2020-11-17 NOTE — ED Triage Notes (Signed)
Pt presents with right side groin pain and possible abscess.

## 2020-11-17 NOTE — Discharge Instructions (Addendum)
Take the doxycycline twice a day for the next 7 days.  Apply a warm compress to the area several times a day.    You can take Ibuprofen as needed for pain relief.    Follow up with your Primary Care Provider if your symptoms do not resolve or improve in the next 3-5 days.

## 2020-11-30 ENCOUNTER — Ambulatory Visit (HOSPITAL_BASED_OUTPATIENT_CLINIC_OR_DEPARTMENT_OTHER): Payer: Managed Care, Other (non HMO)

## 2020-12-14 ENCOUNTER — Ambulatory Visit (HOSPITAL_BASED_OUTPATIENT_CLINIC_OR_DEPARTMENT_OTHER): Payer: Managed Care, Other (non HMO)

## 2021-01-07 ENCOUNTER — Other Ambulatory Visit: Payer: Self-pay

## 2021-01-07 ENCOUNTER — Encounter: Payer: Self-pay | Admitting: Physical Therapy

## 2021-01-07 ENCOUNTER — Ambulatory Visit: Payer: Managed Care, Other (non HMO) | Attending: Obstetrics & Gynecology | Admitting: Physical Therapy

## 2021-01-07 DIAGNOSIS — R102 Pelvic and perineal pain: Secondary | ICD-10-CM | POA: Diagnosis present

## 2021-01-07 DIAGNOSIS — M6281 Muscle weakness (generalized): Secondary | ICD-10-CM | POA: Diagnosis present

## 2021-01-07 DIAGNOSIS — G8929 Other chronic pain: Secondary | ICD-10-CM | POA: Insufficient documentation

## 2021-01-07 DIAGNOSIS — M545 Low back pain, unspecified: Secondary | ICD-10-CM | POA: Diagnosis present

## 2021-01-07 DIAGNOSIS — R278 Other lack of coordination: Secondary | ICD-10-CM

## 2021-01-07 DIAGNOSIS — R252 Cramp and spasm: Secondary | ICD-10-CM | POA: Diagnosis present

## 2021-01-07 NOTE — Therapy (Signed)
[facility] [address], [place], [place], [address] Phone: [phone]   Fax:  [phone]  Physical Therapy Evaluation  Patient Details  Name: Rachel [name] MRN: [id number] Date of Birth: [birth year] Referring Provider (PT): Dr. Hale Bogus   Encounter Date: 01/07/2021   PT End of Session - 01/07/21 1716    Visit Number 1    Date for PT Re-Evaluation 05/10/21    Authorization Type [employer]    PT Start Time 1615    PT Stop Time 1655    PT Time Calculation (min) 40 min    Activity Tolerance Patient tolerated treatment well;Patient limited by pain    Behavior During Therapy [facility] for tasks assessed/performed           Past Medical History:  Diagnosis Date  . Diabetes mellitus without complication ([place])    pre diabetes  . High triglycerides   . Hypertension   . Obesity   . Shingles   . Sleep apnea   . Uterine fibroid     Past Surgical History:  Procedure Laterality Date  . MYOMECTOMY      There were no vitals filed for this visit.    Subjective Assessment - 01/07/21 1616    Subjective Patient is having vaginal pain that started 05/2019. Overtime her vaginal pain is getting worse. sometimes will have pain in the anal region. Patient had a UTI in 2020 and has had pain since then. Patient had a myometomy due to fibroid.    Patient Stated Goals reduce pain and figure out what is causing the pain    Currently in Pain? Yes    Pain Score 10-Worst pain ever    Pain Location Pelvis    Pain Orientation Mid    Pain Descriptors / Indicators Aching;Shooting    Pain Type Acute pain    Pain Onset More than a month ago    Pain Frequency Constant    Aggravating Factors  not sure    Pain Relieving Factors Nothing    Multiple Pain Sites Yes    Pain Score 8    Pain Location Back    Pain Orientation Lower    Pain Descriptors / Indicators Spasm    Pain Type Chronic pain    Pain Onset More than a month ago    Pain  Frequency Intermittent    Aggravating Factors  when she just wakes up    Pain Relieving Factors lay flat on back in bed              [facility] PT Assessment - 01/07/21 0001      Assessment   Medical Diagnosis M62.89 Pelvic floor dysfunction    Referring Provider (PT) Dr. Hale Bogus    Onset Date/Surgical Date --   2020   Prior Therapy none      Precautions   Precautions None      Restrictions   Weight Bearing Restrictions No      Balance Screen   Has the patient fallen in the past 6 months No    Has the patient had a decrease in activity level because of a fear of falling?  No    Is the patient reluctant to leave their home because of a fear of falling?  No      Home Ecologist residence      Prior Function   Level of Independence Independent    Vocation Full time employment  Vocation Requirements sitting    Leisure walk daily for 40 minutes      Cognition   Overall Cognitive Status Within Functional Limits for tasks assessed      Observation/Other Assessments   Skin Integrity scar that is suprapubic is tight      Posture/Postural Control   Posture/Postural Control No significant limitations      ROM / Strength   AROM / PROM / Strength AROM;PROM;Strength      AROM   Lumbar Extension decreased by 25%    Lumbar - Right Side Bend decreased by 25%    Lumbar - Left Side Bend decreased by 25%    Lumbar - Right Rotation decreased by 25%    Lumbar - Left Rotation decreased by 25%      PROM   Right Hip External Rotation  45    Left Hip External Rotation  30      Strength   Right Hip Extension 3+/5    Right Hip ABduction 3+/5    Left Hip Extension 3+/5    Left Hip ABduction 3+/5      Palpation   Spinal mobility T8-L5 decreased movement    SI assessment  ASIS are equal    Palpation comment suprapubic area is tender, tender on the left lower rib cage                      Objective measurements completed on  examination: See above findings.     Pelvic Floor Special Questions - 01/07/21 0001    Prior Pregnancies No    Currently Sexually Active No    Urinary Leakage Yes    Activities that cause leaking Other    Other activities that cause leaking just happens    Urinary urgency Yes    Urinary frequency every hour, does not empty her bladder and bowels    Fecal incontinence Yes   constipation on and off, strain; wipes 4-5 times to clean the anus, stool in the underwear   Skin Integrity Intact    Perineal Body/Introitus  Elevated    External Palpation mons pubis, ischiocavernosus, bubbocavernosus, superficial transverse perineum    Pelvic Floor Internal Exam Patient confirms identification and approves PT to assess pelvic floor and treatment    Exam Type Vaginal    Palpation tenderness located on the levator ani and obturator internist, perineal body    Strength Flicker    Tone hypertonicity                    PT Education - 01/07/21 1702    Education Details Access Code: [email or web address]    Person(s) Educated Patient    Methods Explanation;Demonstration;Verbal cues;Handout    Comprehension Returned demonstration;Verbalized understanding            PT Short Term Goals - 01/07/21 1711      PT SHORT TERM GOAL #1   Title independent with initial HEP    Time 4    Period Weeks    Status New    Target Date 02/04/21      PT SHORT TERM GOAL #2   Title understand how to perfrom diaphragmatic breathing to be able to bulge the pelvic floor    Time 4    Period Weeks    Status New    Target Date 02/04/21      PT SHORT TERM GOAL #3   Title understand how to perform abdominal massage to improve bowel movements  Time 4    Period Weeks    Status New    Target Date 02/04/21      PT SHORT TERM GOAL #4   Title understand correct toileting technique to relax the pelvic floor    Time 4    Period Weeks    Status New    Target Date 02/04/21             PT Long Term Goals -  01/07/21 1712      PT LONG TERM GOAL #1   Title independent with advanced HEP    Time 4    Period Months    Status New    Target Date 05/10/21      PT LONG TERM GOAL #2   Title pelvic pain decreased </= 2-3/10 75% of the time due to improve muscle lengthening and decreased trigger points    Time 12    Period Weeks    Status New    Target Date 04/01/21      PT LONG TERM GOAL #3   Title able to fully empty her bladder due to improve relaxation of the pelvic floor muscles    Time 4    Period Months    Status New    Target Date 05/10/21      PT LONG TERM GOAL #4   Title able to fully empty her bowels and not strain 75% of the time due to correct toileting technique and relaxation of the pelvic floor muscles    Time 4    Period Weeks    Status New    Target Date 05/10/21                  Plan - 01/07/21 1702    Clinical Impression Statement Patient is a 40 year old female with pelvic pain since 2020 after she had a UTI. Patient reports her pelvic pain intermittently at level 10/10 and not sure what makes it worse. Patient reports her intermittent back pain is 8/10 and worse when she wakes up in the morning. Patient has decreased strength of bilateral hips and decreased bilateral hip external rotation. Lumbar ROM is limited by 25%. She has fascial restrictions of her scar suprapubically from her Myomectomy. She has tenderness located on the suprapubica area, mons pubis, ischiocavernosus, bulbocavernosus, perineal body, levator ani, obturator internist, and sides of the bladder. Patient has difficulty with emptying her bladder and bowels. Sometimes she has to strain to have a bowel movement. Patient reports she will just leak urine for no reason. Patient will benefit from skilled therapy to improve tissue mobility, reduce pelvic floor tension and reduce pain so she is able to fully empty her bladder and bowels.    Personal Factors and Comorbidities Comorbidity 2;Age     Comorbidities Myomectomy, Uterine Fibroid    Examination-Activity Limitations Sleep;Continence;Toileting    Stability/Clinical Decision Making Stable/Uncomplicated    Clinical Decision Making Low    Rehab Potential Excellent    PT Frequency 1x / week    PT Duration Other (comment)   4 months   PT Treatment/Interventions ADLs/Self Care Home Management;Biofeedback;Cryotherapy;Electrical Stimulation;Moist Heat;Ultrasound;Therapeutic activities;Therapeutic exercise;Neuromuscular re-education;Patient/family education;Manual techniques;Scar mobilization;Dry needling;Spinal Manipulations;Joint Manipulations    PT Next Visit Plan diaphragmatic breathing, abdominal massage for intestines; manual work to lower abdomen, manual work to thighs    PT Home Exercise Plan Access Code: [email or web address]    Consulted and Agree with Plan of Care Patient  Patient will benefit from skilled therapeutic intervention in order to improve the following deficits and impairments:  Decreased coordination,Decreased range of motion,Increased fascial restricitons,Decreased endurance,Decreased activity tolerance,Impaired flexibility,Decreased strength,Decreased mobility  Visit Diagnosis: Cramp and spasm  Muscle weakness (generalized)  Other lack of coordination  Chronic bilateral low back pain without sciatica  Pelvic pain     Problem List Patient Active Problem List   Diagnosis Date Noted  . Fatty liver 09/21/2020  . Hypercholesterolemia 09/21/2020  . Hyperglycemia due to type 2 diabetes mellitus ([place]) 09/21/2020  . Obesity 09/16/2019  . Deviated nasal septum 08/01/2019  . Hypertrophy of inferior nasal turbinate 08/01/2019  . Diabetes mellitus type II, controlled ([place]) 05/20/2014    Earlie Counts, PT 01/07/21 5:18 PM   [facility] [address], [place], [place], [address] Phone: [phone]   Fax:  [phone]  Name: Rachel [name] MRN: [id number] Date of Birth: [birth year]

## 2021-01-07 NOTE — Patient Instructions (Signed)
10-Minute Breath Meditation for Pelvic Health and Healing by [employer]  On you tube   Access Code: [email or web address] URL: [email or web address] Date: 01/07/2021 Prepared by: Earlie Counts  Program Notes 10-Minute Breath Meditation for Pelvic Health and Healing    Exercises Seated Piriformis Stretch - 1 x daily - 7 x weekly - 1 sets - 2 reps - 30 sec hold Seated Hamstring Stretch - 1 x daily - 7 x weekly - 1 sets - 2 reps - 30 sec hold Seated Hip Adductor Stretch - 1 x daily - 7 x weekly - 1 sets - 2 reps - 30 sec hold Supine Butterfly Groin Stretch - 1 x daily - 7 x weekly - 1 sets - 1 reps - 1 min hold  [facility] [address] [place], [place] [address] Phone # [phone] Fax [phone]

## 2021-01-13 ENCOUNTER — Telehealth (HOSPITAL_BASED_OUTPATIENT_CLINIC_OR_DEPARTMENT_OTHER): Payer: Self-pay | Admitting: Obstetrics & Gynecology

## 2021-01-13 NOTE — Telephone Encounter (Signed)
Called patient today and left message that if she call the office back today that we can add her to scheduled to get her 2nd dosage of HPV.

## 2021-02-08 ENCOUNTER — Ambulatory Visit (INDEPENDENT_AMBULATORY_CARE_PROVIDER_SITE_OTHER): Payer: Managed Care, Other (non HMO) | Admitting: *Deleted

## 2021-02-08 ENCOUNTER — Other Ambulatory Visit: Payer: Self-pay

## 2021-02-08 VITALS — BP 157/85 | HR 76

## 2021-02-08 DIAGNOSIS — Z23 Encounter for immunization: Secondary | ICD-10-CM | POA: Diagnosis not present

## 2021-02-08 NOTE — Progress Notes (Signed)
Rachel [name] is a 40 y.o. female here for her second HPV vaccine. 1st Vaccine 3/28/202 Lot #[id number] EXP:06/16/2022 NDC: [id number] Administered:Left deltoid

## 2021-02-12 ENCOUNTER — Encounter: Payer: Self-pay | Admitting: Neurology

## 2021-02-15 ENCOUNTER — Telehealth: Payer: Self-pay | Admitting: Neurology

## 2021-02-15 NOTE — Telephone Encounter (Signed)
Please see my chart message for reference.  Please advise patient that since she has new symptoms she will need a follow-up appointment with her primary care physician and a new referral for these issues, but I would recommend that she continue follow-up with her neurologist at [facility], Dr. Bland Span, as he has seen her more recently with an appointment recommended for December 2021. I do not see where she had a follow-up with him though.  I recommend that she touch base with him.    I recommend a routine follow-up for her sleep apnea with one of our nurse practitioners, I do not see a follow-up pending for her sleep apnea check.

## 2021-02-15 NOTE — Telephone Encounter (Signed)
See telephone note from 02/15/21.

## 2021-03-08 ENCOUNTER — Encounter: Payer: Managed Care, Other (non HMO) | Admitting: Physical Therapy

## 2021-03-29 ENCOUNTER — Encounter: Payer: Managed Care, Other (non HMO) | Admitting: Physical Therapy

## 2021-04-12 ENCOUNTER — Encounter: Payer: Managed Care, Other (non HMO) | Admitting: Physical Therapy

## 2021-05-03 ENCOUNTER — Encounter: Payer: Managed Care, Other (non HMO) | Admitting: Physical Therapy

## 2021-06-14 ENCOUNTER — Other Ambulatory Visit: Payer: Self-pay

## 2021-06-14 ENCOUNTER — Ambulatory Visit (INDEPENDENT_AMBULATORY_CARE_PROVIDER_SITE_OTHER): Payer: Managed Care, Other (non HMO) | Admitting: *Deleted

## 2021-06-14 VITALS — BP 154/77 | HR 70

## 2021-06-14 DIAGNOSIS — Z23 Encounter for immunization: Secondary | ICD-10-CM | POA: Diagnosis not present

## 2021-06-14 NOTE — Progress Notes (Signed)
Pt here for 3rd dose of Gardasil vaccine. Tolerated well.

## 2021-08-11 ENCOUNTER — Encounter: Payer: Self-pay | Admitting: Nurse Practitioner

## 2021-08-11 ENCOUNTER — Other Ambulatory Visit: Payer: Self-pay

## 2021-08-11 ENCOUNTER — Ambulatory Visit: Payer: Managed Care, Other (non HMO) | Admitting: Nurse Practitioner

## 2021-08-11 VITALS — BP 128/70 | HR 83 | Temp 98.5°F | Ht 64.0 in | Wt 252.6 lb

## 2021-08-11 DIAGNOSIS — R350 Frequency of micturition: Secondary | ICD-10-CM | POA: Diagnosis not present

## 2021-08-11 DIAGNOSIS — E119 Type 2 diabetes mellitus without complications: Secondary | ICD-10-CM

## 2021-08-11 DIAGNOSIS — Z7689 Persons encountering health services in other specified circumstances: Secondary | ICD-10-CM | POA: Diagnosis not present

## 2021-08-11 DIAGNOSIS — E7800 Pure hypercholesterolemia, unspecified: Secondary | ICD-10-CM

## 2021-08-11 DIAGNOSIS — R809 Proteinuria, unspecified: Secondary | ICD-10-CM

## 2021-08-11 DIAGNOSIS — I10 Essential (primary) hypertension: Secondary | ICD-10-CM

## 2021-08-11 DIAGNOSIS — Z2821 Immunization not carried out because of patient refusal: Secondary | ICD-10-CM

## 2021-08-11 DIAGNOSIS — R29898 Other symptoms and signs involving the musculoskeletal system: Secondary | ICD-10-CM

## 2021-08-11 DIAGNOSIS — L659 Nonscarring hair loss, unspecified: Secondary | ICD-10-CM

## 2021-08-11 DIAGNOSIS — Z6841 Body Mass Index (BMI) 40.0 and over, adult: Secondary | ICD-10-CM

## 2021-08-11 DIAGNOSIS — R1012 Left upper quadrant pain: Secondary | ICD-10-CM

## 2021-08-11 LAB — POCT URINALYSIS DIPSTICK
Bilirubin, UA: NEGATIVE
Glucose, UA: NEGATIVE
Ketones, UA: NEGATIVE
Leukocytes, UA: NEGATIVE
Nitrite, UA: NEGATIVE
Protein, UA: POSITIVE — AB
Spec Grav, UA: 1.02 (ref 1.010–1.025)
Urobilinogen, UA: 2 E.U./dL — AB
pH, UA: 7 (ref 5.0–8.0)

## 2021-08-11 LAB — POCT UA - MICROALBUMIN
Albumin/Creatinine Ratio, Urine, POC: <30
Creatinine, POC: 200 mg/dL
Microalbumin Ur, POC: 30 mg/L

## 2021-08-11 MED ORDER — RYBELSUS 7 MG PO TABS: 1.0000 | ORAL_TABLET | Freq: Every day | ORAL | 0 refills | Status: DC

## 2021-08-11 MED ORDER — LOSARTAN POTASSIUM 25 MG PO TABS: 25.0000 mg | ORAL_TABLET | Freq: Every day | ORAL | 2 refills | Status: DC

## 2021-08-11 NOTE — Patient Instructions (Signed)

## 2021-08-11 NOTE — Progress Notes (Signed)
I,Tianna Badgett,acting as a Education administrator for [employer], FNP.,have documented all relevant documentation on the behalf of [name], FNP,as directed by  [name], FNP while in the presence of [name], [place].  I,Tianna Badgett,acting as a Education administrator for [employer], FNP.,have documented all relevant documentation on the behalf of [name], FNP,as directed by  [name], FNP while in the presence of [name], [place].  This visit occurred during the SARS-CoV-2 public health emergency. Safety protocols were in place, including screening questions prior to the visit, additional usage of staff PPE, and extensive cleaning of exam room while observing appropriate contact time as indicated for disinfecting solutions.  Subjective:     Patient ID: Rachel [name] , female    DOB: July 24, 1981 , 40 y.o.   MRN: [id number]   Chief Complaint  Patient presents with   Establish Care    HPI  Patient is here to establish care. She was going to [facility] has not been seen since before Covid. She was looking for a practitioner that was more relatable. She works with IT. Single. No children.  She dates women. She found the office on the internet.   She reports she has diabetes. 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 Her last HgbA1c was about 6.0 - 4 months ago. She has only taken metformin, she is now having more GI upset and had a different taste. She has a CPAP machine - has not been using as often. Unsure of why she stopped using regularly. She is unable to take crestor due to causing leg swelling. She had a myomectomy, she has seen the GYN and mentioned her abdomen pain as well.   Reports fatigue in her legs and thighs, there are moments she can not move. She is having a constant pain to her left lateral abdomen. She is also having pain to posterior flank area.   She has complaints of abdominal pain and leg weakness.     Past Medical History:  Diagnosis Date    Diabetes mellitus without complication (HCC)    pre diabetes   High triglycerides    Hypertension    Obesity    Shingles    Sleep apnea    Uterine fibroid      Family History  Problem Relation Age of Onset   Healthy Mother    Breast cancer Maternal Aunt    Pancreatic cancer Maternal Uncle    Colon cancer Maternal Grandmother    Diabetes Maternal Grandmother    Hypertension Maternal Grandmother    Heart disease Maternal Grandfather    Heart attack Maternal Grandfather    Hypertension Other      Current Outpatient Medications:    cholecalciferol (VITAMIN D3) 25 MCG (1000 UT) tablet, Take 1,000 Units by mouth daily., Disp: , Rfl:    losartan (COZAAR) 25 MG tablet, Take 1 tablet (25 mg total) by mouth daily., Disp: 30 tablet, Rfl: 2   metFORMIN (GLUCOPHAGE) 500 MG tablet, Take 500-1,000 mg by mouth See admin instructions. 1060m in the am and 500 mg in the pm, Disp: , Rfl:    Semaglutide (RYBELSUS) 7 MG TABS, Take 1 tablet by mouth daily. Take 30 minutes before first meal, Disp: 90 tablet, Rfl: 0   Allergies  Allergen Reactions   Crestor [Rosuvastatin Calcium] Swelling    Swelling in the legs      Review of Systems  Constitutional: Negative.   Respiratory: Negative.    Cardiovascular:  Negative.   Gastrointestinal:  Positive for abdominal pain (Since September 2022) and nausea. Negative for diarrhea and vomiting.  Neurological: Negative.  Negative for dizziness and headaches.  Psychiatric/Behavioral: Negative.      Today's Vitals   08/11/21 1118  BP: 128/70  Pulse: 83  Temp: 98.5 F (36.9 C)  TempSrc: Oral  Weight: 252 lb 9.6 oz (114.6 kg)  Height: _0  (1.626 m)   Body mass index is 43.36 kg/m.  Wt Readings from Last 3 Encounters:  08/11/21 252 lb 9.6 oz (114.6 kg)  11/16/20 244 lb (110.7 kg)  09/21/20 246 lb (111.6 kg)    Objective:  Physical Exam Vitals reviewed.  Constitutional:      General: She is not in acute distress.    Appearance: Normal  appearance. She is obese.  Cardiovascular:     Rate and Rhythm: Normal rate and regular rhythm.     Pulses: Normal pulses.     Heart sounds: Normal heart sounds. No murmur heard. Pulmonary:     Effort: Pulmonary effort is normal. No respiratory distress.     Breath sounds: Normal breath sounds. No wheezing.  Abdominal:     General: Bowel sounds are normal. There is no distension.     Palpations: Abdomen is soft. There is no mass.     Tenderness: There is abdominal tenderness (left upper and lateral abdomen). There is no rebound.  Skin:    General: Skin is warm and dry.  Neurological:     General: No focal deficit present.     Mental Status: She is alert and oriented to person, place, and time.     Cranial Nerves: No cranial nerve deficit.     Motor: No weakness.  Psychiatric:        Mood and Affect: Mood normal.        Behavior: Behavior normal.        Thought Content: Thought content normal.        Judgment: Judgment normal.        Assessment And Plan:     1. Controlled type 2 diabetes mellitus without complication, without long-term current use of insulin ([place]) Comments: Will add Rybelsus samples given. Discussed side effects.  - POCT UA - Microalbumin - Hemoglobin A1c - Semaglutide (RYBELSUS) 7 MG TABS; Take 1 tablet by mouth daily. Take 30 minutes before first meal  Dispense: 90 tablet; Refill: 0  2. Essential hypertension Comments: Blood pressure is well controlled, changing to losartan from lisinopril due to hair loss. Will f/u in 4-6 weeks. - losartan (COZAAR) 25 MG tablet; Take 1 tablet (25 mg total) by mouth daily.  Dispense: 30 tablet; Refill: 2  3. Hypercholesterolemia Comments: Will check lipid panel. Avoid fried and fatty foods. - CMP14+EGFR - Lipid panel  4. Frequent urination Comments: Urinalysis shows trace blood, will send for culture. She also has a history of prediabetes will check to make sure not worsened.  - POCT Urinalysis Dipstick (81002) -  Culture, Urine  5. Proteinuria, unspecified type Comments: Encouraged to increase water intake and will recheck at next office visit - Culture, Urine  6. Left upper quadrant abdominal pain Comments: Tenderness to left lateral abdomen, will check CT scan.  - CT ABDOMEN PELVIS W WO CONTRAST; Future - CMP14+EGFR - CBC with Differential/Platelet - Amylase - Lipase  7. Weakness of both lower extremities Comments: Strength is equal bilaterally, will check TSH and vitamin B12.  - CMP14+EGFR - TSH - Vitamin B12 - VITAMIN D 25  Hydroxy (Vit-D Deficiency, Fractures)  8. Hair loss Comments: Will stop lisinopril once she has completed current bottle then after taking losartan will see if hair growth improves.  - losartan (COZAAR) 25 MG tablet; Take 1 tablet (25 mg total) by mouth daily.  Dispense: 30 tablet; Refill: 2  9. Morbid obesity with BMI of 40.0-44.9, adult (HCC) Chronic Discussed healthy diet and regular exercise options  Encouraged to exercise at least 150 minutes per week with 2 days of strength training  10. COVID-19 vaccination declined Patient declined influenza vaccination at this time. Patient is aware that influenza vaccine prevents illness in 70% of healthy people, and reduces hospitalizations to 30-70% in elderly. This vaccine is recommended annually. Pt is willing to accept risk associated with refusing vaccination.  11. Establishing care with new doctor, encounter for     Patient was given opportunity to ask questions. Patient verbalized understanding of the plan and was able to repeat key elements of the plan. All questions were answered to their satisfaction.  [name], FNP   I, [name], FNP, have reviewed all documentation for this visit. The documentation on 08/11/21 for the exam, diagnosis, procedures, and orders are all accurate and complete.   IF YOU HAVE BEEN REFERRED TO A SPECIALIST, IT MAY TAKE 1-2 WEEKS TO SCHEDULE/PROCESS THE REFERRAL. IF YOU  HAVE NOT HEARD FROM US/SPECIALIST IN TWO WEEKS, PLEASE GIVE [place] A CALL AT [phone] X [phone].   THE PATIENT IS ENCOURAGED TO PRACTICE SOCIAL DISTANCING DUE TO THE COVID-19 PANDEMIC.

## 2021-08-12 LAB — CMP14+EGFR
ALT: 17 IU/L (ref 0–32)
AST: 15 IU/L (ref 0–40)
Albumin/Globulin Ratio: 1.6 (ref 1.2–2.2)
Albumin: 4.5 g/dL (ref 3.8–4.8)
Alkaline Phosphatase: 72 IU/L (ref 44–121)
BUN/Creatinine Ratio: 17 (ref 9–23)
BUN: 13 mg/dL (ref 6–24)
Bilirubin Total: 1.1 mg/dL (ref 0.0–1.2)
CO2: 25 mmol/L (ref 20–29)
Calcium: 9.4 mg/dL (ref 8.7–10.2)
Chloride: 100 mmol/L (ref 96–106)
Creatinine, Ser: 0.76 mg/dL (ref 0.57–1.00)
Globulin, Total: 2.9 g/dL (ref 1.5–4.5)
Glucose: 149 mg/dL — ABNORMAL HIGH (ref 70–99)
Potassium: 4.2 mmol/L (ref 3.5–5.2)
Sodium: 138 mmol/L (ref 134–144)
Total Protein: 7.4 g/dL (ref 6.0–8.5)
eGFR: 102 mL/min/{1.73_m2} (ref >59)

## 2021-08-12 LAB — VITAMIN B12: Vitamin B-12: >2000 pg/mL — ABNORMAL HIGH (ref 232–1245)

## 2021-08-12 LAB — CBC WITH DIFFERENTIAL/PLATELET
Basophils Absolute: 0 10*3/uL (ref 0.0–0.2)
Basos: 0 %
EOS (ABSOLUTE): 0.1 10*3/uL (ref 0.0–0.4)
Eos: 2 %
Hematocrit: 39.5 % (ref 34.0–46.6)
Hemoglobin: 12.7 g/dL (ref 11.1–15.9)
Immature Grans (Abs): 0 10*3/uL (ref 0.0–0.1)
Immature Granulocytes: 0 %
Lymphocytes Absolute: 1.8 10*3/uL (ref 0.7–3.1)
Lymphs: 31 %
MCH: 27.8 pg (ref 26.6–33.0)
MCHC: 32.2 g/dL (ref 31.5–35.7)
MCV: 86 fL (ref 79–97)
Monocytes Absolute: 0.3 10*3/uL (ref 0.1–0.9)
Monocytes: 6 %
Neutrophils Absolute: 3.7 10*3/uL (ref 1.4–7.0)
Neutrophils: 61 %
Platelets: 279 10*3/uL (ref 150–450)
RBC: 4.57 x10E6/uL (ref 3.77–5.28)
RDW: 14.6 % (ref 11.7–15.4)
WBC: 6 10*3/uL (ref 3.4–10.8)

## 2021-08-12 LAB — LIPID PANEL
Chol/HDL Ratio: 3 ratio (ref 0.0–4.4)
Cholesterol, Total: 168 mg/dL (ref 100–199)
HDL: 56 mg/dL (ref >39)
LDL Chol Calc (NIH): 101 mg/dL — ABNORMAL HIGH (ref 0–99)
Triglycerides: 57 mg/dL (ref 0–149)
VLDL Cholesterol Cal: 11 mg/dL (ref 5–40)

## 2021-08-12 LAB — TSH: TSH: 2.59 u[IU]/mL (ref 0.450–4.500)

## 2021-08-12 LAB — LIPASE: Lipase: 24 U/L (ref 14–72)

## 2021-08-12 LAB — AMYLASE: Amylase: 99 U/L (ref 31–110)

## 2021-08-12 LAB — HEMOGLOBIN A1C
Est. average glucose Bld gHb Est-mCnc: 148 mg/dL
Hgb A1c MFr Bld: 6.8 % — ABNORMAL HIGH (ref 4.8–5.6)

## 2021-08-12 LAB — VITAMIN D 25 HYDROXY (VIT D DEFICIENCY, FRACTURES): Vit D, 25-Hydroxy: 36.7 ng/mL (ref 30.0–100.0)

## 2021-08-14 LAB — URINE CULTURE

## 2021-08-17 NOTE — Progress Notes (Signed)
I have added some labs, pending results we may refer to Neurology for further evaluation

## 2021-08-26 ENCOUNTER — Other Ambulatory Visit: Payer: Self-pay

## 2021-08-26 ENCOUNTER — Emergency Department (HOSPITAL_BASED_OUTPATIENT_CLINIC_OR_DEPARTMENT_OTHER)
Admission: EM | Admit: 2021-08-26 | Discharge: 2021-08-26 | Disposition: A | Discharge status: Home / Self Care | Payer: Managed Care, Other (non HMO) | Attending: Emergency Medicine | Admitting: Emergency Medicine

## 2021-08-26 ENCOUNTER — Encounter (HOSPITAL_BASED_OUTPATIENT_CLINIC_OR_DEPARTMENT_OTHER): Payer: Self-pay | Admitting: *Deleted

## 2021-08-26 DIAGNOSIS — E119 Type 2 diabetes mellitus without complications: Secondary | ICD-10-CM | POA: Diagnosis not present

## 2021-08-26 DIAGNOSIS — Z7984 Long term (current) use of oral hypoglycemic drugs: Secondary | ICD-10-CM | POA: Diagnosis not present

## 2021-08-26 DIAGNOSIS — L0291 Cutaneous abscess, unspecified: Secondary | ICD-10-CM

## 2021-08-26 DIAGNOSIS — L02411 Cutaneous abscess of right axilla: Secondary | ICD-10-CM | POA: Insufficient documentation

## 2021-08-26 MED ORDER — LIDOCAINE HCL (PF) 1 % IJ SOLN
30.0000 mL | Freq: Once | INTRAMUSCULAR | Status: AC
  Administered 2021-08-26: 30 mL
  Filled 2021-08-26: qty 30

## 2021-08-26 MED ORDER — CLINDAMYCIN HCL 150 MG PO CAPS: 300.0000 mg | ORAL_CAPSULE | Freq: Three times a day (TID) | ORAL | 0 refills | Status: AC

## 2021-08-26 NOTE — ED Notes (Signed)
Abscess drained by provider, only bloody drainage noted.  Pt tolerated well.

## 2021-08-26 NOTE — ED Triage Notes (Signed)
Abscess right axilla. Draining.

## 2021-08-26 NOTE — ED Provider Notes (Signed)
[facility] EMERGENCY DEPARTMENT Provider Note   CSN: [id number] Arrival date & time: 08/26/21  1824     History  Chief Complaint  Patient presents with   Abscess    Rachel [name] is a 41 y.o. female.  41 year old female with a past medical history of diabetes presents to the ED with a chief complaint of right axilla abscess has been ongoing for the past 4 days.  She reports she felt chills the first day this showed up, later this turned red, has worsened as the days have gone by.  This began actively draining earlier this morning.  She denies any prior history of these, has not had these in the past.  No other complaints.  Prior history of MRSA.  The history is provided by the patient and medical records.  Abscess Associated symptoms: no fever       Home Medications Prior to Admission medications   Medication Sig Start Date End Date Taking? Authorizing Provider  clindamycin (CLEOCIN) 150 MG capsule Take 2 capsules (300 mg total) by mouth 3 (three) times daily for 7 days. 08/26/21 09/02/21 Yes Angelisa Winthrop, Beverley Fiedler, PA-C  cholecalciferol (VITAMIN D3) 25 MCG (1000 UT) tablet Take 1,000 Units by mouth daily.    [provider]  losartan (COZAAR) 25 MG tablet Take 1 tablet (25 mg total) by mouth daily. 08/11/21 08/11/22  [name], FNP  metFORMIN (GLUCOPHAGE) 500 MG tablet Take 500-1,000 mg by mouth See admin instructions. 1000mg  in the am and 500 mg in the pm 05/05/14   [provider]  Semaglutide (RYBELSUS) 7 MG TABS Take 1 tablet by mouth daily. Take 30 minutes before first meal 08/11/21   [name], FNP  omeprazole (PRILOSEC OTC) 20 MG tablet Take 20 mg by mouth daily.  05/20/14 12/28/19  [provider]      Allergies    Crestor [rosuvastatin calcium]    Review of Systems   Review of Systems  Constitutional:  Negative for chills and fever.  Skin:  Positive for wound.  All other systems reviewed and are negative.  Physical Exam Updated  Vital Signs BP 135/88 (BP Location: Right Arm)    Pulse 99    Temp 98.1 F (36.7 C) (Oral)    Resp 18    Ht 5\' 4"  (1.626 m)    Wt 114.6 kg    LMP 08/05/2021    SpO2 99%    BMI 43.37 kg/m  Physical Exam Vitals and nursing note reviewed.  Constitutional:      Appearance: Normal appearance.  HENT:     Head: Normocephalic and atraumatic.     Mouth/Throat:     Mouth: Mucous membranes are moist.  Cardiovascular:     Rate and Rhythm: Normal rate.  Pulmonary:     Effort: Pulmonary effort is normal.  Musculoskeletal:     Cervical back: Normal range of motion and neck supple.  Skin:    General: Skin is warm and dry.     Findings: Erythema present.       Neurological:     Mental Status: She is alert and oriented to person, place, and time.    ED Results / Procedures / Treatments   Labs (all labs ordered are listed, but only abnormal results are displayed) Labs Reviewed - No data to display  EKG None  Radiology No results found.  Procedures .Marland KitchenIncision and Drainage  Date/Time: 08/26/2021 8:48 PM Performed by: Janeece Fitting, PA-C Authorized by: Janeece Fitting, PA-C  Consent:    Consent obtained:  Verbal   Consent given by:  Patient   Risks discussed:  Bleeding   Alternatives discussed:  No treatment and delayed treatment Universal protocol:    Patient identity confirmed:  Verbally with patient Location:    Type:  Abscess   Location:  Upper extremity   Upper extremity location:  Arm   Arm location:  R upper arm Pre-procedure details:    Skin preparation:  Povidone-iodine Sedation:    Sedation type:  None Anesthesia:    Anesthesia method:  Topical application and local infiltration   Local anesthetic:  Lidocaine 1% w/o epi Procedure type:    Complexity:  Simple Procedure details:    Incision types:  Stab incision   Drainage:  Bloody   Drainage amount:  Scant   Wound treatment:  Wound left open   Packing materials:  None Post-procedure details:    Procedure  completion:  Tolerated well, no immediate complications    Medications Ordered in ED Medications  lidocaine (PF) (XYLOCAINE) 1 % injection 30 mL (30 mLs Infiltration Given by Other 08/26/21 2020)    ED Course/ Medical Decision Making/ A&P                           Medical Decision Making  Patient with suppressed medical history of diabetes presents to the ED with chief complaint of right axilla abscess is been ongoing for the past 4 days.  Positive for chills, no fevers, no other episodes in the past.  During evaluation there is a small 1 cm abscess indurated, erythematous actively draining, I discussed risks of I&D on today's visit.  She preferred I&D.  We were only able to get a very small amount of exudate.  Discussed treatment with antibiotics as patient does have a history of diabetes concern for delayed healing.  She is agreeable of this at this time.  Return precautions discussed at length, patient stable for discharge.  Portions of this note were generated with Lobbyist. Dictation errors may occur despite best attempts at proofreading.  Final Clinical Impression(s) / ED Diagnoses Final diagnoses:  Abscess    Rx / DC Orders ED Discharge Orders          Ordered    clindamycin (CLEOCIN) 150 MG capsule  3 times daily        08/26/21 2046              Janeece Fitting, PA-C 08/26/21 2048    Truddie Hidden, MD 08/26/21 2252

## 2021-08-26 NOTE — Discharge Instructions (Addendum)
I have prescribed a short course of antibiotics to help treat your infection.  Please take 2 tablets daily 3 times a day for the next 7 days.  If you experience any worsening redness, worsening pain you will need to return to the emergency department.

## 2021-08-31 ENCOUNTER — Telehealth: Payer: Self-pay

## 2021-08-31 NOTE — Telephone Encounter (Signed)
I left the patient a detailed message that Laurance Flatten, DNP, FNP-BC said that the pt's CT of abdomen shows that the pt has possible fibroids and wants to know if the patient has a gynecologist.

## 2021-09-08 LAB — AUTOIMMUNE PROFILE
Anti Nuclear Antibody (ANA): NEGATIVE
Complement C3, Serum: 170 mg/dL — ABNORMAL HIGH (ref 82–167)
dsDNA Ab: <1 IU/mL (ref 0–9)

## 2021-09-08 LAB — CK: Total CK: 170 U/L (ref 32–182)

## 2021-09-08 LAB — SPECIMEN STATUS REPORT

## 2021-09-08 LAB — C-REACTIVE PROTEIN: CRP: 3 mg/L (ref 0–10)

## 2021-09-09 ENCOUNTER — Other Ambulatory Visit: Payer: Self-pay | Admitting: Nurse Practitioner

## 2021-09-09 DIAGNOSIS — R29898 Other symptoms and signs involving the musculoskeletal system: Secondary | ICD-10-CM

## 2021-09-13 ENCOUNTER — Encounter: Payer: Self-pay | Admitting: Nurse Practitioner

## 2021-09-20 ENCOUNTER — Encounter (HOSPITAL_BASED_OUTPATIENT_CLINIC_OR_DEPARTMENT_OTHER): Payer: Self-pay | Admitting: Obstetrics & Gynecology

## 2021-09-22 ENCOUNTER — Ambulatory Visit (INDEPENDENT_AMBULATORY_CARE_PROVIDER_SITE_OTHER): Payer: Managed Care, Other (non HMO) | Admitting: Obstetrics & Gynecology

## 2021-09-22 ENCOUNTER — Other Ambulatory Visit: Payer: Self-pay

## 2021-09-22 ENCOUNTER — Other Ambulatory Visit (HOSPITAL_COMMUNITY)
Admission: RE | Admit: 2021-09-22 | Discharge: 2021-09-22 | Disposition: A | Discharge status: Home / Self Care | Payer: Managed Care, Other (non HMO) | Source: Ambulatory Visit | Attending: Obstetrics & Gynecology | Admitting: Obstetrics & Gynecology

## 2021-09-22 ENCOUNTER — Encounter (HOSPITAL_BASED_OUTPATIENT_CLINIC_OR_DEPARTMENT_OTHER): Payer: Self-pay | Admitting: Obstetrics & Gynecology

## 2021-09-22 VITALS — BP 138/79 | HR 73 | Ht 65.0 in | Wt 257.6 lb

## 2021-09-22 DIAGNOSIS — K069 Disorder of gingiva and edentulous alveolar ridge, unspecified: Secondary | ICD-10-CM | POA: Diagnosis not present

## 2021-09-22 DIAGNOSIS — Z113 Encounter for screening for infections with a predominantly sexual mode of transmission: Secondary | ICD-10-CM | POA: Diagnosis present

## 2021-09-22 DIAGNOSIS — Z01419 Encounter for gynecological examination (general) (routine) without abnormal findings: Secondary | ICD-10-CM | POA: Diagnosis not present

## 2021-09-22 DIAGNOSIS — B009 Herpesviral infection, unspecified: Secondary | ICD-10-CM

## 2021-09-22 DIAGNOSIS — N764 Abscess of vulva: Secondary | ICD-10-CM

## 2021-09-22 DIAGNOSIS — E119 Type 2 diabetes mellitus without complications: Secondary | ICD-10-CM

## 2021-09-22 DIAGNOSIS — D251 Intramural leiomyoma of uterus: Secondary | ICD-10-CM | POA: Diagnosis not present

## 2021-09-22 MED ORDER — VALACYCLOVIR HCL 1 G PO TABS
ORAL_TABLET | ORAL | 1 refills | Status: AC
Start: 2021-09-22 — End: ?

## 2021-09-22 MED ORDER — SULFAMETHOXAZOLE-TRIMETHOPRIM 800-160 MG PO TABS: 1.0000 | ORAL_TABLET | Freq: Two times a day (BID) | ORAL | 0 refills | Status: DC

## 2021-09-22 NOTE — Patient Instructions (Addendum)
[facility] [address], [place] [address] Phone: [phone] Fax: [phone]  [facility]:  Sharmaine Base  Align probiotic  Hibiclens

## 2021-09-22 NOTE — Addendum Note (Signed)
Addended by: Megan Salon on: 09/22/2021 12:59 PM   Modules accepted: Orders

## 2021-09-22 NOTE — Progress Notes (Addendum)
41 y.o. [place] American female here for annual exam.  Reports cycles are definitively better.  Flow lasts 3 days.  She uses pads with wings.  On a heavy day, had to change pads about every 6 hours.  Had laparoscopic with mini lap with Dr. Alen Bleacher 04/2011.  Had 758 grams of tissue removed.  Had axillary abscess and was seen in ER.  Treated with Doxycycline.  I&D performed.  No culture performed.  Pt feels like she has a vulvar abscess as well.  Has recurrent issues with these.  Has also has recent facial fever blister.  Did not know what to take for this.  She does desire STD testing today.   Sexually active: No.  The current method of family planning is abstinence.    Smoker:  vape nicotine  Health Maintenance: Pap:  09/16/2019 Negative History of abnormal Pap:  no MMG:  11/02/2020 Negative Colonoscopy:  09/17/2019 Screening Labs: 07/2021   reports that she has quit smoking. She has never used smokeless tobacco. She reports current alcohol use. She reports that she does not use drugs.  Past Medical History:  Diagnosis Date   Diabetes mellitus without complication ([place])    pre diabetes   High triglycerides    Hypertension    Obesity    Shingles    Sleep apnea    Uterine fibroid     Past Surgical History:  Procedure Laterality Date   MYOMECTOMY      Current Outpatient Medications  Medication Sig Dispense Refill   cholecalciferol (VITAMIN D3) 25 MCG (1000 UT) tablet Take 1,000 Units by mouth daily.     Semaglutide (RYBELSUS) 7 MG TABS Take 1 tablet by mouth daily. Take 30 minutes before first meal 90 tablet 0   losartan (COZAAR) 25 MG tablet Take 1 tablet (25 mg total) by mouth daily. (Patient not taking: Reported on 09/22/2021) 30 tablet 2   metFORMIN (GLUCOPHAGE) 500 MG tablet Take 500-1,000 mg by mouth See admin instructions. 1000mg  in the am and 500 mg in the pm (Patient not taking: Reported on 09/22/2021)     No current facility-administered  medications for this visit.    Family History  Problem Relation Age of Onset   Healthy Mother    Breast cancer Maternal Aunt    Pancreatic cancer Maternal Uncle    Colon cancer Maternal Grandmother    Diabetes Maternal Grandmother    Hypertension Maternal Grandmother    Heart disease Maternal Grandfather    Heart attack Maternal Grandfather    Hypertension Other     Review of Systems  Genitourinary:        Vulvar lesion  All other systems reviewed and are negative.  Exam:   BP 138/79 (BP Location: Right Arm, Patient Position: Sitting, Cuff Size: Large)    Pulse 73    Ht 5\' 5"  (1.651 m) Comment: reported   Wt 257 lb 9.6 oz (116.8 kg)    BMI 42.87 kg/m   Height: 5\' 5"  (165.1 cm) (reported)  General appearance: alert, cooperative and appears stated age Head: Normocephalic, without obvious abnormality, atraumatic Neck: no adenopathy, supple, symmetrical, trachea midline and thyroid normal to inspection and palpation Lungs: clear to auscultation bilaterally Breasts: normal appearance, no masses or tenderness Heart: regular rate and rhythm Abdomen: soft, non-tender; bowel sounds normal; no masses,  no organomegaly Extremities: extremities normal, atraumatic, no cyanosis or edema Skin: Skin color, texture, turgor normal. No rashes or lesions Lymph nodes: Cervical, supraclavicular, and axillary  nodes normal. No abnormal inguinal nodes palpated Neurologic: Grossly normal   Pelvic: External genitalia:  right inferior vulvar subdermal lesion with erythema, no fluctuance, mild erythema              Urethra:  normal appearing urethra with no masses, tenderness or lesions              Bartholins and Skenes: normal                 Vagina: normal appearing vagina with normal color and no discharge, no lesions              Cervix: no lesions              Pap taken: No. Bimanual Exam:  Uterus:  about 12-14 weeks size, much smaller, mobile              Adnexa: normal adnexa and no mass,  fullness, tenderness               Rectovaginal: Confirms               Anus:  normal sphincter tone, no lesions  Chaperone, Octaviano Batty, CMA, was present for exam.  Assessment/Plan: 1. Well woman exam with routine gynecological exam - pap neg with neg HR HPV 2021.  Not indicated today. - MMG 10/2020 - colonoscopy due age 48 - vaccine reviewed/updated  2. Disease of gingiva due to recurrent oral herpes simplex virus (HSV) infection - directions for valtrex use with any new HSV lesions - valACYclovir (VALTREX) 1000 MG tablet; Take 2 tabs and repeat again in 12 hours for fever blisters.  Dispense: 30 tablet; Refill: 1  3. Vulvar abscess - topical Hibiclens recommended - would recommend culture if there is every drainage - sulfamethoxazole-trimethoprim (BACTRIM DS) 800-160 MG tablet; Take 1 tablet by mouth 2 (two) times daily.  Dispense: 14 tablet; Refill: 0  4. Screening examination for STD (sexually transmitted disease) - RPR+HBsAg+HIV - Hepatitis C antibody - Urine GC/Chl testing obtained  5. Intramural uterine fibroid - h/o laparoscopic myomectomy 04/2020  6. Controlled type 2 diabetes mellitus without complication, without long-term current use of insulin ([place])

## 2021-09-23 LAB — HEPATITIS C ANTIBODY: Hep C Virus Ab: <0.1 s/co ratio (ref 0.0–0.9)

## 2021-09-23 LAB — CERVICOVAGINAL ANCILLARY ONLY
Chlamydia: NEGATIVE
Comment: NEGATIVE
Comment: NORMAL
Neisseria Gonorrhea: NEGATIVE

## 2021-09-23 LAB — RPR+HBSAG+HIV
HIV Screen 4th Generation wRfx: NONREACTIVE
Hepatitis B Surface Ag: NEGATIVE
RPR Ser Ql: NONREACTIVE

## 2021-09-27 ENCOUNTER — Ambulatory Visit: Payer: Managed Care, Other (non HMO) | Admitting: Nurse Practitioner

## 2021-09-29 ENCOUNTER — Ambulatory Visit: Payer: Managed Care, Other (non HMO) | Admitting: Nurse Practitioner

## 2021-10-06 ENCOUNTER — Ambulatory Visit: Payer: Managed Care, Other (non HMO) | Admitting: Nurse Practitioner

## 2021-10-06 ENCOUNTER — Other Ambulatory Visit: Payer: Self-pay

## 2021-10-06 ENCOUNTER — Encounter: Payer: Self-pay | Admitting: Nurse Practitioner

## 2021-10-06 ENCOUNTER — Other Ambulatory Visit: Payer: Self-pay | Admitting: Nurse Practitioner

## 2021-10-06 VITALS — BP 132/78 | HR 78 | Temp 98.3°F | Ht 65.0 in | Wt 261.6 lb

## 2021-10-06 DIAGNOSIS — K219 Gastro-esophageal reflux disease without esophagitis: Secondary | ICD-10-CM | POA: Diagnosis not present

## 2021-10-06 DIAGNOSIS — I10 Essential (primary) hypertension: Secondary | ICD-10-CM

## 2021-10-06 DIAGNOSIS — Z6841 Body Mass Index (BMI) 40.0 and over, adult: Secondary | ICD-10-CM

## 2021-10-06 DIAGNOSIS — E1169 Type 2 diabetes mellitus with other specified complication: Secondary | ICD-10-CM

## 2021-10-06 DIAGNOSIS — R413 Other amnesia: Secondary | ICD-10-CM

## 2021-10-06 DIAGNOSIS — R1012 Left upper quadrant pain: Secondary | ICD-10-CM | POA: Diagnosis not present

## 2021-10-06 DIAGNOSIS — E669 Obesity, unspecified: Secondary | ICD-10-CM

## 2021-10-06 MED ORDER — RYBELSUS 7 MG PO TABS: 1.0000 | ORAL_TABLET | Freq: Every day | ORAL | 0 refills | Status: AC

## 2021-10-06 MED ORDER — OMEPRAZOLE MAGNESIUM 20 MG PO TBEC: 20.0000 mg | DELAYED_RELEASE_TABLET | Freq: Every day | ORAL | 1 refills | Status: DC

## 2021-10-06 MED ORDER — LOSARTAN POTASSIUM 25 MG PO TABS: 25.0000 mg | ORAL_TABLET | Freq: Every day | ORAL | 2 refills | Status: AC

## 2021-10-06 NOTE — Progress Notes (Signed)
I,Rachel [name],acting as a Education administrator for [employer], FNP.,have documented all relevant documentation on the behalf of Rachel Brine, FNP,as directed by  Rachel Brine, FNP while in the presence of Rachel [name], [place].  This visit occurred during the SARS-CoV-2 public health emergency.  Safety protocols were in place, including screening questions prior to the visit, additional usage of staff PPE, and extensive cleaning of exam room while observing appropriate contact time as indicated for disinfecting solutions.  Subjective:     Patient ID: Rachel [name] , female    DOB: [birth year] , 41 y.o.   MRN: [id number]   Chief Complaint  Patient presents with   Hypertension    HPI  Patient is here for follow up on new medication Rybelsus and changing Losartan. At her last visit she was started on Rybelsus but has stopped the medication. Episodes of staring off and having periods of confusion and feels like having memory losses. She is forgetting how to do different things at work. Denies loss of bowel or bladder. She has a family history of dementia/alzheimers with her grandmother  Hypertension This is a chronic problem. The current episode started more than 1 year ago. The problem is controlled. Pertinent negatives include no anxiety or shortness of breath. Risk factors for coronary artery disease include obesity and sedentary lifestyle. Past treatments include ACE inhibitors (she is to start ARB - losartan). There is no history of angina. There is no history of chronic renal disease.    Past Medical History:  Diagnosis Date   Diabetes mellitus without complication (HCC)    pre diabetes   High triglycerides    Hypertension    Obesity    Shingles    Sleep apnea    Uterine fibroid      Family History  Problem Relation Age of Onset   Healthy Mother    Breast cancer Maternal Aunt    Pancreatic cancer Maternal Uncle    Colon cancer Maternal Grandmother    Diabetes Maternal Grandmother     Hypertension Maternal Grandmother    Heart disease Maternal Grandfather    Heart attack Maternal Grandfather    Hypertension Other      Current Outpatient Medications:    omeprazole (PRILOSEC OTC) 20 MG tablet, Take 1 tablet (20 mg total) by mouth daily., Disp: 90 tablet, Rfl: 1   cholecalciferol (VITAMIN D3) 25 MCG (1000 UT) tablet, Take 1,000 Units by mouth daily., Disp: , Rfl:    losartan (COZAAR) 25 MG tablet, Take 1 tablet (25 mg total) by mouth daily., Disp: 30 tablet, Rfl: 2   Semaglutide (RYBELSUS) 7 MG TABS, Take 1 tablet by mouth daily. Take 30 minutes before first meal, Disp: 90 tablet, Rfl: 0   valACYclovir (VALTREX) 1000 MG tablet, Take 2 tabs and repeat again in 12 hours for fever blisters., Disp: 30 tablet, Rfl: 1   Allergies  Allergen Reactions   Crestor [Rosuvastatin Calcium] Swelling    Swelling in the legs      Review of Systems  Constitutional: Negative.   Respiratory: Negative.  Negative for shortness of breath and wheezing.   Cardiovascular: Negative.   Gastrointestinal: Negative.   Neurological: Negative.        Reports having confusion and difficulty with remembering, began May 2022.    Psychiatric/Behavioral: Negative.      Today's Vitals   10/06/21 1413  BP: 132/78  Pulse: 78  Temp: 98.3 F (36.8 C)  TempSrc: Oral  Weight: 261 lb 9.6 oz (118.7 kg)  Height: 5\' 5"  (1.651 m)   Body mass index is 43.53 kg/m.   Objective:  Physical Exam Vitals reviewed.  Constitutional:      General: She is not in acute distress.    Appearance: Normal appearance. She is obese.  Cardiovascular:     Rate and Rhythm: Normal rate and regular rhythm.     Pulses: Normal pulses.     Heart sounds: Normal heart sounds. No murmur heard. Pulmonary:     Effort: Pulmonary effort is normal. No respiratory distress.     Breath sounds: Normal breath sounds. No wheezing.  Skin:    General: Skin is warm and dry.  Neurological:     General: No focal deficit present.      Mental Status: She is alert and oriented to person, place, and time.     Cranial Nerves: No cranial nerve deficit.     Motor: No weakness.  Psychiatric:        Mood and Affect: Mood normal.        Behavior: Behavior normal.        Thought Content: Thought content normal.        Judgment: Judgment normal.        Assessment And Plan:     1. Type 2 diabetes mellitus with obesity ([place]) Comments: She has only taken Rybelsus sample, did not pick up Rx. Have resent rx. Advised if has new hoarsness to call to offce. - Semaglutide (RYBELSUS) 7 MG TABS; Take 1 tablet by mouth daily. Take 30 minutes before first meal  Dispense: 90 tablet; Refill: 0 - Hemoglobin A1c  2. Essential hypertension Comments: Blood pressure is stable. She is to start losartan to see if her hair loss improves - losartan (COZAAR) 25 MG tablet; Take 1 tablet (25 mg total) by mouth daily.  Dispense: 30 tablet; Refill: 2  3. Morbid obesity with BMI of 40.0-44.9, adult ([place]) She is encouraged to strive for BMI less than 30 to decrease cardiac risk. Advised to aim for at least 150 minutes of exercise per week.  4. Gastroesophageal reflux disease without esophagitis Comments: She had been taking omeprazole would like refill - omeprazole (PRILOSEC OTC) 20 MG tablet; Take 1 tablet (20 mg total) by mouth daily.  Dispense: 90 tablet; Refill: 1  5. Left upper quadrant abdominal pain Comments: She has seen Dr. Benson Norway in the past about 3 years ago, will wait to see the CT abdomen results.   6. Memory changes Comments: Will check for metabolic cause. She may need a referral to Neurology to evaluate for possible seizures vs dementia (least likely due to age) - Vitamin B12 - TSH - CBC - T pallidum Screening [place]    Patient was given opportunity to ask questions. Patient verbalized understanding of the plan and was able to repeat key elements of the plan. All questions were answered to their satisfaction.  Rachel Brine, FNP    I, Rachel Brine, FNP, have reviewed all documentation for this visit. The documentation on 10/06/21 for the exam, diagnosis, procedures, and orders are all accurate and complete.   IF YOU HAVE BEEN REFERRED TO A SPECIALIST, IT MAY TAKE 1-2 WEEKS TO SCHEDULE/PROCESS THE REFERRAL. IF YOU HAVE NOT HEARD FROM US/SPECIALIST IN TWO WEEKS, PLEASE GIVE [place] A CALL AT [phone].   THE PATIENT IS ENCOURAGED TO PRACTICE SOCIAL DISTANCING DUE TO THE COVID-19 PANDEMIC.

## 2021-10-06 NOTE — Patient Instructions (Signed)

## 2021-10-07 LAB — HEMOGLOBIN A1C
Est. average glucose Bld gHb Est-mCnc: 151 mg/dL
Hgb A1c MFr Bld: 6.9 % — ABNORMAL HIGH (ref 4.8–5.6)

## 2021-10-07 LAB — CBC
Hematocrit: 39.6 % (ref 34.0–46.6)
Hemoglobin: 12.8 g/dL (ref 11.1–15.9)
MCH: 27.9 pg (ref 26.6–33.0)
MCHC: 32.3 g/dL (ref 31.5–35.7)
MCV: 86 fL (ref 79–97)
Platelets: 283 10*3/uL (ref 150–450)
RBC: 4.59 x10E6/uL (ref 3.77–5.28)
RDW: 14.1 % (ref 11.7–15.4)
WBC: 6.4 10*3/uL (ref 3.4–10.8)

## 2021-10-07 LAB — TSH: TSH: 3.39 u[IU]/mL (ref 0.450–4.500)

## 2021-10-07 LAB — VITAMIN B12: Vitamin B-12: 1060 pg/mL (ref 232–1245)

## 2021-10-07 LAB — T PALLIDUM SCREENING CASCADE: T pallidum Antibodies (TP-PA): NONREACTIVE

## 2021-10-22 NOTE — Telephone Encounter (Signed)
Pt should get OTC  ?

## 2021-12-06 ENCOUNTER — Encounter: Payer: Managed Care, Other (non HMO) | Admitting: Nurse Practitioner

## 2022-12-13 ENCOUNTER — Ambulatory Visit (INDEPENDENT_AMBULATORY_CARE_PROVIDER_SITE_OTHER): Payer: Medicaid Other

## 2022-12-13 ENCOUNTER — Ambulatory Visit
Admission: RE | Admit: 2022-12-13 | Discharge: 2022-12-13 | Disposition: A | Discharge status: Home / Self Care | Payer: Medicaid Other | Source: Ambulatory Visit | Attending: Internal Medicine | Admitting: Internal Medicine

## 2022-12-13 VITALS — BP 132/82 | HR 80 | Temp 98.6°F | Resp 16

## 2022-12-13 DIAGNOSIS — S63613A Unspecified sprain of left middle finger, initial encounter: Secondary | ICD-10-CM | POA: Diagnosis not present

## 2022-12-13 MED ORDER — NAPROXEN 375 MG PO TABS: 375.0000 mg | ORAL_TABLET | Freq: Two times a day (BID) | ORAL | 0 refills | Status: DC
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
## 2022-12-13 NOTE — ED Triage Notes (Signed)
Pt states injured left middle holding dog's collar "I think I bent it back" 1 week ago- c/o pain/swelling-NAD-steady gait

## 2023-10-24 ENCOUNTER — Emergency Department (HOSPITAL_BASED_OUTPATIENT_CLINIC_OR_DEPARTMENT_OTHER)
Admission: EM | Admit: 2023-10-24 | Discharge: 2023-10-24 | Disposition: A | Discharge status: Home / Self Care | Attending: Emergency Medicine | Admitting: Emergency Medicine

## 2023-10-24 ENCOUNTER — Encounter (HOSPITAL_BASED_OUTPATIENT_CLINIC_OR_DEPARTMENT_OTHER): Payer: Self-pay | Admitting: Emergency Medicine

## 2023-10-24 ENCOUNTER — Other Ambulatory Visit: Payer: Self-pay

## 2023-10-24 DIAGNOSIS — Z79899 Other long term (current) drug therapy: Secondary | ICD-10-CM | POA: Diagnosis not present

## 2023-10-24 DIAGNOSIS — J101 Influenza due to other identified influenza virus with other respiratory manifestations: Secondary | ICD-10-CM | POA: Diagnosis not present

## 2023-10-24 DIAGNOSIS — R059 Cough, unspecified: Secondary | ICD-10-CM | POA: Diagnosis present

## 2023-10-24 DIAGNOSIS — E119 Type 2 diabetes mellitus without complications: Secondary | ICD-10-CM | POA: Diagnosis not present

## 2023-10-24 DIAGNOSIS — J111 Influenza due to unidentified influenza virus with other respiratory manifestations: Secondary | ICD-10-CM | POA: Diagnosis not present

## 2023-10-24 DIAGNOSIS — I10 Essential (primary) hypertension: Secondary | ICD-10-CM | POA: Insufficient documentation

## 2023-10-24 LAB — RESP PANEL BY RT-PCR (RSV, FLU A&B, COVID)  RVPGX2
Influenza A by PCR: POSITIVE — AB
Influenza B by PCR: NEGATIVE
Resp Syncytial Virus by PCR: NEGATIVE
SARS Coronavirus 2 by RT PCR: NEGATIVE

## 2023-10-24 LAB — GROUP A STREP BY PCR: Group A Strep by PCR: NOT DETECTED

## 2023-10-24 MED ORDER — ACETAMINOPHEN 325 MG PO TABS
325.0000 mg | ORAL_TABLET | Freq: Once | ORAL | Status: AC
  Administered 2023-10-24: 325 mg via ORAL
  Filled 2023-10-24: qty 1

## 2023-10-24 MED ORDER — ACETAMINOPHEN 325 MG PO TABS
650.0000 mg | ORAL_TABLET | Freq: Once | ORAL | Status: AC | PRN
  Administered 2023-10-24: 650 mg via ORAL
  Filled 2023-10-24: qty 2

## 2023-10-24 MED ORDER — IBUPROFEN 800 MG PO TABS
800.0000 mg | ORAL_TABLET | Freq: Once | ORAL | Status: AC
  Administered 2023-10-24: 800 mg via ORAL
  Filled 2023-10-24: qty 1

## 2023-10-24 MED ORDER — GUAIFENESIN 100 MG/5ML PO LIQD: 100.0000 mg | Freq: Four times a day (QID) | ORAL | 0 refills | Status: AC | PRN

## 2023-10-24 NOTE — ED Notes (Signed)
 Discharge instructions reviewed with patient. Patient verbalizes understanding, no further questions at this time. Medications and follow up information provided. No acute distress noted at time of departure.

## 2023-10-24 NOTE — Discharge Instructions (Addendum)
 Evaluation today revealed that you have the flu.  This is likely the cause of your symptoms.  As we discussed if you develop chest pain, shortness of breath, uncontrolled fever, lethargy or any other concerning symptom please return to emergency department further evaluation.  Treatment at home is mostly supportive which includes rest and hydration with Gatorade and water.  You can also take Tylenol ibuprofen to control your fever.  I sent Robitussin to your pharmacy to help with your cough.  Recommend taking at night for you to sleep.  If your symptoms persist recommend that you follow-up with your PCP.

## 2023-10-24 NOTE — ED Triage Notes (Signed)
 Pt POV steady gait- c/o bodyaches, congestion, headache x 1 day. Fever last night.  Denies n/v/d.   Good po intake.

## 2023-10-24 NOTE — ED Provider Notes (Signed)
 [facility] Provider Note   CSN: [id number] Arrival date & time: 10/24/23  1453     History  Chief Complaint  Patient presents with   URI   HPI Rachel [name] is a 43 y.o. female with history of type 2 diabetes and hypertension presenting for body aches, congestion and cough and headache.  Started yesterday evening.  Also states she had a fever last night as well.  Denies nausea vomiting diarrhea.  Denies visual disturbance and nuchal rigidity.  Denies chest pain shortness of breath.  Cough is nonproductive and has been persistent.     URI      Home Medications Prior to Admission medications   Medication Sig Start Date End Date Taking? Authorizing Provider  guaiFENesin (ROBITUSSIN) 100 MG/5ML liquid Take 5-10 mLs (100-200 mg total) by mouth every 6 (six) hours as needed for cough or to loosen phlegm. 10/24/23  Yes Gareth Eagle, PA-C  cholecalciferol (VITAMIN D3) 25 MCG (1000 UT) tablet Take 1,000 Units by mouth daily.    [provider]  losartan (COZAAR) 25 MG tablet Take 1 tablet (25 mg total) by mouth daily. 10/06/21 10/06/22  Arnette Felts, FNP  naproxen (NAPROSYN) 375 MG tablet Take 1 tablet (375 mg total) by mouth 2 (two) times daily with a meal. 12/13/22   Wallis Bamberg, PA-C  omeprazole (PRILOSEC OTC) 20 MG tablet TAKE 1 TABLET BY MOUTH EVERY DAY 10/22/21   Arnette Felts, FNP  Semaglutide (RYBELSUS) 7 MG TABS Take 1 tablet by mouth daily. Take 30 minutes before first meal 10/06/21   Arnette Felts, FNP  valACYclovir (VALTREX) 1000 MG tablet Take 2 tabs and repeat again in 12 hours for fever blisters. 09/22/21   Jerene Bears, MD      Allergies    Crestor [rosuvastatin calcium]    Review of Systems   See HPI  Physical Exam Updated Vital Signs BP 131/80   Pulse (!) 118   Temp (!) 101.6 F (38.7 C)   Resp 19   Ht 5\' 5"  (1.651 m)   Wt 127.9 kg   LMP 10/20/2023 (Exact Date)   SpO2 100%   BMI 46.93 kg/m  Physical  Exam Vitals and nursing note reviewed.  HENT:     Head: Normocephalic and atraumatic.     Mouth/Throat:     Mouth: Mucous membranes are moist.  Eyes:     General:        Right eye: No discharge.        Left eye: No discharge.     Conjunctiva/sclera: Conjunctivae normal.  Cardiovascular:     Rate and Rhythm: Normal rate and regular rhythm.     Pulses: Normal pulses.     Heart sounds: Normal heart sounds.  Pulmonary:     Effort: Pulmonary effort is normal.     Breath sounds: Normal breath sounds and air entry. No decreased breath sounds, wheezing, rhonchi or rales.  Abdominal:     General: Abdomen is flat.     Palpations: Abdomen is soft.  Skin:    General: Skin is warm and dry.  Neurological:     General: No focal deficit present.  Psychiatric:        Mood and Affect: Mood normal.     ED Results / Procedures / Treatments   Labs (all labs ordered are listed, but only abnormal results are displayed) Labs Reviewed  RESP PANEL BY RT-PCR (RSV, FLU A&B, COVID)  RVPGX2 -  Abnormal; Notable for the following components:      Result Value   Influenza A by PCR POSITIVE (*)    All other components within normal limits  GROUP A STREP BY PCR    EKG None  Radiology No results found.  Procedures Procedures    Medications Ordered in ED Medications  acetaminophen (TYLENOL) tablet 325 mg (has no administration in time range)  ibuprofen (ADVIL) tablet 800 mg (has no administration in time range)  acetaminophen (TYLENOL) tablet 650 mg (650 mg Oral Given 10/24/23 1517)    ED Course/ Medical Decision Making/ A&P                                 Medical Decision Making Risk OTC drugs. Prescription drug management.   43 year old well-appearing female presenting for URI symptoms.  Exam was unremarkable.  Respiratory PCR was positive for the flu.  Suspect this is the etiology of her symptoms.  Overall she is well-appearing, hemodynamically stable, nontoxic in no acute distress.   Considered pneumonia but unlikely given no adventitious lung sounds and normal air entry.  Doubt meningitis given lack of symptoms.  Advised supportive treatment at home for the flu.  Sent Robitussin to her pharmacy.  Advised to follow-up with her PCP.  Discussed return precautions.  Discharged in good condition.        Final Clinical Impression(s) / ED Diagnoses Final diagnoses:  Flu    Rx / DC Orders ED Discharge Orders          Ordered    guaiFENesin (ROBITUSSIN) 100 MG/5ML liquid  Every 6 hours PRN        10/24/23 1725              Gareth Eagle, PA-C 10/24/23 1726    Franne Forts, DO 10/28/23 1729

## 2024-01-09 ENCOUNTER — Encounter (HOSPITAL_BASED_OUTPATIENT_CLINIC_OR_DEPARTMENT_OTHER): Payer: Self-pay | Admitting: Emergency Medicine

## 2024-01-09 ENCOUNTER — Other Ambulatory Visit: Payer: Self-pay

## 2024-01-09 ENCOUNTER — Emergency Department (HOSPITAL_BASED_OUTPATIENT_CLINIC_OR_DEPARTMENT_OTHER)

## 2024-01-09 DIAGNOSIS — M79672 Pain in left foot: Secondary | ICD-10-CM | POA: Insufficient documentation

## 2024-01-09 DIAGNOSIS — Z7984 Long term (current) use of oral hypoglycemic drugs: Secondary | ICD-10-CM | POA: Diagnosis not present

## 2024-01-09 DIAGNOSIS — E119 Type 2 diabetes mellitus without complications: Secondary | ICD-10-CM | POA: Diagnosis not present

## 2024-01-09 NOTE — ED Triage Notes (Signed)
 Pt via POV c/o left foot pain x 1 week with no known injury. No hx gout. Pt is ambulatory but has a limp.

## 2024-01-10 ENCOUNTER — Emergency Department (HOSPITAL_BASED_OUTPATIENT_CLINIC_OR_DEPARTMENT_OTHER)
Admission: EM | Admit: 2024-01-10 | Discharge: 2024-01-10 | Disposition: A | Discharge status: Home / Self Care | Attending: Emergency Medicine | Admitting: Emergency Medicine

## 2024-01-10 DIAGNOSIS — M79672 Pain in left foot: Secondary | ICD-10-CM | POA: Diagnosis not present

## 2024-01-10 DIAGNOSIS — L989 Disorder of the skin and subcutaneous tissue, unspecified: Secondary | ICD-10-CM

## 2024-01-10 MED ORDER — ACETAMINOPHEN 500 MG PO TABS
1000.0000 mg | ORAL_TABLET | Freq: Once | ORAL | Status: AC
  Administered 2024-01-10: 1000 mg via ORAL
  Filled 2024-01-10: qty 2

## 2024-01-10 MED ORDER — CEPHALEXIN 250 MG PO CAPS
500.0000 mg | ORAL_CAPSULE | Freq: Once | ORAL | Status: AC
  Administered 2024-01-10: 500 mg via ORAL
  Filled 2024-01-10: qty 2

## 2024-01-10 MED ORDER — CEPHALEXIN 500 MG PO CAPS: 500.0000 mg | ORAL_CAPSULE | Freq: Four times a day (QID) | ORAL | 0 refills | Status: AC

## 2024-01-10 MED ORDER — NAPROXEN 250 MG PO TABS
500.0000 mg | ORAL_TABLET | Freq: Once | ORAL | Status: AC
  Administered 2024-01-10: 500 mg via ORAL
  Filled 2024-01-10: qty 2

## 2024-01-10 NOTE — ED Provider Notes (Signed)
 [facility] Provider Note   CSN: [id number] Arrival date & time: 01/09/24  2322     History  Chief Complaint  Patient presents with   Foot Pain    Rachel [name] is a 43 y.o. female.  The history is provided by the patient.  Foot Pain This is a new problem. The current episode started more than 1 week ago. The problem occurs constantly. The problem has been gradually worsening. Pertinent negatives include no chest pain, no abdominal pain, no headaches and no shortness of breath. Nothing aggravates the symptoms. Nothing relieves the symptoms. The treatment provided no relief.  Patient with DM with 1 week of foot pain, no trauma that she recalls.  There is a yellowed slight raised area mid sole.      Home Medications Prior to Admission medications   Medication Sig Start Date End Date Taking? Authorizing Provider  cholecalciferol (VITAMIN D3) 25 MCG (1000 UT) tablet Take 1,000 Units by mouth daily.    [provider]  guaiFENesin  (ROBITUSSIN) 100 MG/5ML liquid Take 5-10 mLs (100-200 mg total) by mouth every 6 (six) hours as needed for cough or to loosen phlegm. 10/24/23   Robinson, John K, PA-C  losartan  (COZAAR ) 25 MG tablet Take 1 tablet (25 mg total) by mouth daily. 10/06/21 10/06/22  Susanna Epley, FNP  naproxen  (NAPROSYN ) 375 MG tablet Take 1 tablet (375 mg total) by mouth 2 (two) times daily with a meal. 12/13/22   Adolph Hoop, PA-C  omeprazole  (PRILOSEC  OTC) 20 MG tablet TAKE 1 TABLET BY MOUTH EVERY DAY 10/22/21   Susanna Epley, FNP  Semaglutide  (RYBELSUS ) 7 MG TABS Take 1 tablet by mouth daily. Take 30 minutes before first meal 10/06/21   Susanna Epley, FNP  valACYclovir  (VALTREX ) 1000 MG tablet Take 2 tabs and repeat again in 12 hours for fever blisters. 09/22/21   Lillian Rein, MD      Allergies    Crestor [rosuvastatin calcium]    Review of Systems   Review of Systems  Constitutional:  Negative for fever.  Respiratory:   Negative for shortness of breath.   Cardiovascular:  Negative for chest pain.  Gastrointestinal:  Negative for abdominal pain.  Neurological:  Negative for headaches.  All other systems reviewed and are negative.   Physical Exam Updated Vital Signs BP (!) 167/90 (BP Location: Right Arm)   Pulse 82   Temp (!) 97.2 F (36.2 C)   Resp 18   Ht 5\' 5"  (1.651 m)   Wt 127 kg   LMP 01/07/2024 (Exact Date)   SpO2 100%   BMI 46.59 kg/m  Physical Exam Vitals and nursing note reviewed.  Constitutional:      General: She is not in acute distress.    Appearance: She is well-developed.  HENT:     Head: Normocephalic and atraumatic.     Nose: Nose normal.  Eyes:     Pupils: Pupils are equal, round, and reactive to light.  Cardiovascular:     Rate and Rhythm: Normal rate and regular rhythm.     Pulses: Normal pulses.     Heart sounds: Normal heart sounds.  Pulmonary:     Effort: Pulmonary effort is normal. No respiratory distress.     Breath sounds: Normal breath sounds.  Abdominal:     General: Bowel sounds are normal. There is no distension.     Palpations: Abdomen is soft.     Tenderness: There is no abdominal  tenderness. There is no guarding or rebound.  Musculoskeletal:        General: Normal range of motion.     Cervical back: Neck supple.       Feet:  Skin:    General: Skin is dry.     Capillary Refill: Capillary refill takes less than 2 seconds.     Findings: No erythema or rash.  Neurological:     General: No focal deficit present.     Deep Tendon Reflexes: Reflexes normal.  Psychiatric:        Mood and Affect: Mood normal.     ED Results / Procedures / Treatments   Labs (all labs ordered are listed, but only abnormal results are displayed) Labs Reviewed - No data to display  EKG None  Radiology DG Foot Complete Left Result Date: 01/09/2024 CLINICAL DATA:  Pain.  No known injury. EXAM: LEFT FOOT - COMPLETE 3+ VIEW COMPARISON:  None Available. FINDINGS: There  is no evidence of fracture or dislocation. There is no evidence of arthropathy or other focal bone abnormality. Soft tissues are unremarkable. IMPRESSION: Negative. Electronically Signed   By: Tyron Gallon M.D.   On: 01/09/2024 23:54    Procedures Procedures    Medications Ordered in ED Medications  cephALEXin  (KEFLEX ) capsule 500 mg (has no administration in time range)  naproxen  (NAPROSYN ) tablet 500 mg (has no administration in time range)  acetaminophen  (TYLENOL ) tablet 1,000 mg (has no administration in time range)    ED Course/ Medical Decision Making/ A&P                                 Medical Decision Making Patient with foot pain no trauma recollected   Amount and/or Complexity of Data Reviewed External Data Reviewed: notes.    Details: Previous notes reviewed  Radiology: ordered and independent interpretation performed.    Details: No gas no FB  Risk OTC drugs. Prescription drug management. Risk Details: The area is not warm, there is no opening in the skin.  I suspect this is a splinter attempting to work it's way out.  I will start antibiotics as this is a diabetic patient and I have referred to foot surgeon for ongoing care. Warm soaks with dial soap.  Close follow up with foot surgeon and PMD.  Stable for discharge.  Strict returns.      Final Clinical Impression(s) / ED Diagnoses Final diagnoses:  Foot pain, left   No signs of systemic illness or infection. The patient is nontoxic-appearing on exam and vital signs are within normal limits.  I have reviewed the triage vital signs and the nursing notes. Pertinent labs & imaging results that were available during my care of the patient were reviewed by me and considered in my medical decision making (see chart for details). After history, exam, and medical workup I feel the patient has been appropriately medically screened and is safe for discharge home. Pertinent diagnoses were discussed with the patient. Patient  was given return precautions.  Rx / DC Orders ED Discharge Orders     None         Dereona Kolodny, MD 01/10/24 1610

## 2024-01-10 NOTE — Discharge Instructions (Signed)
 Soak in warm water with dial liquid soap,

## 2024-01-10 NOTE — ED Notes (Signed)
 Pt reports this her left foot has been hurting x 1 weeks Worse today.  Unk injury  Sole of foot has a yellowed area that is very painful to touch.  ?foreign body

## 2024-01-12 ENCOUNTER — Encounter: Payer: Self-pay | Admitting: Podiatry

## 2024-01-12 ENCOUNTER — Ambulatory Visit (INDEPENDENT_AMBULATORY_CARE_PROVIDER_SITE_OTHER): Admitting: Podiatry

## 2024-01-12 VITALS — Ht 65.0 in | Wt 280.0 lb

## 2024-01-12 DIAGNOSIS — L02612 Cutaneous abscess of left foot: Secondary | ICD-10-CM

## 2024-01-12 MED ORDER — MUPIROCIN 2 % EX OINT
1.0000 | TOPICAL_OINTMENT | Freq: Two times a day (BID) | CUTANEOUS | 1 refills | Status: DC
Start: 2024-01-12 — End: 2024-03-17

## 2024-01-12 NOTE — Progress Notes (Signed)
   Chief Complaint  Patient presents with   Foot Injury    Pt is here due to left foot on the bottom has a small opening she states it has been bothering her for about 2-3 weeks was seen in the ED on 5/20 states X-RAYS were done and put on antibiotic, states area still hurts and hard to walk on.     HPI: 43 y.o. female presenting today as a new patient for evaluation of a symptomatic lesion to the plantar aspect of the left foot.  Painful for about 2-3 weeks.  Idiopathic.  She went to the ED on 01/09/2024 x-rays taken negative for any foreign body.  Referred here.  She was prescribed antibiotics but she has not picked them up yet.  Past Medical History:  Diagnosis Date   Diabetes mellitus without complication (HCC)    pre diabetes   High triglycerides    Hypertension    Obesity    Shingles    Sleep apnea    Uterine fibroid     Past Surgical History:  Procedure Laterality Date   MYOMECTOMY      Allergies  Allergen Reactions   Crestor [Rosuvastatin Calcium] Swelling    Swelling in the legs      Physical Exam: General: The patient is alert and oriented x3 in no acute distress.  Dermatology: Skin is warm, dry and supple bilateral lower extremities.   Vascular: Palpable pedal pulses bilaterally. Capillary refill within normal limits.  No appreciable edema.  No erythema.  Neurological: Grossly intact via light touch  Musculoskeletal Exam: No pedal deformities noted  DG Foot Complete Left 01/09/2024 FINDINGS: There is no evidence of fracture or dislocation. There is no evidence of arthropathy or other focal bone abnormality. Soft tissues are unremarkable.   IMPRESSION: Negative.  Assessment/Plan of Care: 1.  Likely foreign body abscess plantar aspect of the left foot  -Patient evaluated.  X-rays taken in the ED reviewed -The abscess to the plantar aspect of the foot was perforated using a tissue nipper and debrided.  Purulent drainage noted.  Granular wound base.  It  does not extend into deeper tissue -Prescription for mupirocin ointment.  Apply daily with a Band-Aid -She will pick up the Keflex  500 mg QID #28 prescribed at the emergency department on 01/10/2024 -Advised against going barefoot. -Return to clinic 3 weeks        Dot Gazella, DPM [facility]  Dr. Dot Gazella, DPM    [address] [place], [place] [address]                Office [phone]  Fax [phone]

## 2024-01-24 DIAGNOSIS — L03012 Cellulitis of left finger: Secondary | ICD-10-CM | POA: Diagnosis not present

## 2024-01-24 DIAGNOSIS — E11628 Type 2 diabetes mellitus with other skin complications: Secondary | ICD-10-CM | POA: Diagnosis not present

## 2024-02-02 ENCOUNTER — Ambulatory Visit: Admitting: Podiatry

## 2024-03-08 ENCOUNTER — Ambulatory Visit: Admitting: Podiatry

## 2024-03-17 ENCOUNTER — Other Ambulatory Visit: Payer: Self-pay

## 2024-03-17 ENCOUNTER — Ambulatory Visit
Admission: RE | Admit: 2024-03-17 | Discharge: 2024-03-17 | Disposition: A | Discharge status: Home / Self Care | Attending: Physician Assistant | Admitting: Physician Assistant

## 2024-03-17 VITALS — BP 124/89 | HR 83 | Temp 98.2°F | Resp 20 | Ht 65.0 in | Wt 278.0 lb

## 2024-03-17 DIAGNOSIS — L02619 Cutaneous abscess of unspecified foot: Secondary | ICD-10-CM | POA: Diagnosis not present

## 2024-03-17 MED ORDER — MUPIROCIN 2 % EX OINT: 1.0000 | TOPICAL_OINTMENT | Freq: Two times a day (BID) | CUTANEOUS | 0 refills | Status: AC

## 2024-03-17 NOTE — ED Triage Notes (Signed)
 Pt presents with complaints of left foot pain x 2 days. States she saw her foot doctor approximately two months ago, performed an I&D on the area. Not sure if it is an abscess, splinter, etc. Located on bottom of left foot (middle). Currently rates overall pain a 9/10. Cannot bear weight without extreme pain. OTC Ibuprofen  taken at home with temporary relief.

## 2024-03-17 NOTE — Discharge Instructions (Addendum)
 You were seen today for concerns of an abscess on your left foot. We performed an incision and drainage of the abscess to provide relief.  I recommend soaking your foot for 15-20 minutes one to two times per day to help encourage drainage and provide relief You can alternate Tylenol  and Ibuprofen  for pain management as needed. I am sending in a script for an antibiotic ointment called mupirocin  for you to apply to the area twice per day for 7 days to help prevent infection.  I recommend follow up with Podiatry for ongoing management and monitoring. If you notice the following please go to the ED: swelling of the foot, difficulty moving your foot, severe pain, copious amounts of drainage or bleeding, numbness or tingling of the affected area or toes, fever and chills.

## 2024-03-17 NOTE — ED Provider Notes (Addendum)
 [facility]    CSN: [id number] Arrival date & time: 03/17/24  1333      History   Chief Complaint Chief Complaint  Patient presents with   Foot Pain    Abscess - Entered by patient    HPI Rachel [name] is a 43 y.o. female.   HPI  Pt presents today for concerns of left foot pain on the sole  She states the pain flared up on Friday and became worse yesterday She is concerned for an abscess - was previously seen by Podiatry for same concern and had I&D? Performed there She denies drainage or bleeding from the area    Past Medical History:  Diagnosis Date   Diabetes mellitus without complication (HCC)    pre diabetes   High triglycerides    Hypertension    Obesity    Shingles    Sleep apnea    Uterine fibroid     Patient Active Problem List   Diagnosis Date Noted   Intramural uterine fibroid 09/22/2021   Morbid obesity with BMI of 40.0-44.9, adult (HCC) 08/11/2021   Fatty liver 09/21/2020   Hypercholesterolemia 09/21/2020   Hyperglycemia due to type 2 diabetes mellitus (HCC) 09/21/2020   Obesity 09/16/2019   Deviated nasal septum 08/01/2019   Hypertrophy of inferior nasal turbinate 08/01/2019   Diabetes mellitus type II, controlled (HCC) 05/20/2014    Past Surgical History:  Procedure Laterality Date   MYOMECTOMY      OB History     Gravida  0   Para  0   Term  0   Preterm  0   AB  0   Living  0      SAB  0   IAB  0   Ectopic  0   Multiple  0   Live Births  0            Home Medications    Prior to Admission medications   Medication Sig Start Date End Date Taking? Authorizing Provider  mupirocin  ointment (BACTROBAN ) 2 % Apply 1 Application topically 2 (two) times daily for 7 days. 03/17/24 03/24/24 Yes Solimar Maiden E, PA-C  cephALEXin  (KEFLEX ) 500 MG capsule Take 1 capsule (500 mg total) by mouth 4 (four) times daily. 01/10/24   Palumbo, April, MD  cholecalciferol (VITAMIN D3) 25 MCG (1000 UT) tablet Take 1,000 Units  by mouth daily.    [provider]  guaiFENesin  (ROBITUSSIN) 100 MG/5ML liquid Take 5-10 mLs (100-200 mg total) by mouth every 6 (six) hours as needed for cough or to loosen phlegm. 10/24/23   Robinson, John K, PA-C  losartan  (COZAAR ) 25 MG tablet Take 1 tablet (25 mg total) by mouth daily. 10/06/21 10/06/22  Georgina Speaks, FNP  naproxen  (NAPROSYN ) 375 MG tablet Take 1 tablet (375 mg total) by mouth 2 (two) times daily with a meal. 12/13/22   Christopher Savannah, PA-C  omeprazole  (PRILOSEC  OTC) 20 MG tablet TAKE 1 TABLET BY MOUTH EVERY DAY 10/22/21   Georgina Speaks, FNP  Semaglutide  (RYBELSUS ) 7 MG TABS Take 1 tablet by mouth daily. Take 30 minutes before first meal 10/06/21   Georgina Speaks, FNP  valACYclovir  (VALTREX ) 1000 MG tablet Take 2 tabs and repeat again in 12 hours for fever blisters. 09/22/21   Cleotilde Ronal RAMAN, MD    Family History Family History  Problem Relation Age of Onset   Healthy Mother    Breast cancer Maternal Aunt    Pancreatic cancer Maternal Uncle  Colon cancer Maternal Grandmother    Diabetes Maternal Grandmother    Hypertension Maternal Grandmother    Heart disease Maternal Grandfather    Heart attack Maternal Grandfather    Hypertension Other     Social History Social History   Tobacco Use   Smoking status: Former    Types: Cigarettes   Smokeless tobacco: Never  Vaping Use   Vaping status: Some Days  Substance Use Topics   Alcohol use: Yes    Comment: socially   Drug use: No     Allergies   Crestor [rosuvastatin calcium]   Review of Systems Review of Systems  Musculoskeletal:        Left foot pain      Physical Exam Triage Vital Signs ED Triage Vitals  Encounter Vitals Group     BP 03/17/24 1351 124/89     Girls Systolic BP Percentile --      Girls Diastolic BP Percentile --      Boys Systolic BP Percentile --      Boys Diastolic BP Percentile --      Pulse Rate 03/17/24 1351 83     Resp 03/17/24 1351 20     Temp 03/17/24 1351 98.2 F  (36.8 C)     Temp Source 03/17/24 1351 Oral     SpO2 03/17/24 1351 97 %     Weight 03/17/24 1353 278 lb (126.1 kg)     Height 03/17/24 1353 5' 5 (1.651 m)     Head Circumference --      Peak Flow --      Pain Score 03/17/24 1352 9     Pain Loc --      Pain Education --      Exclude from Growth Chart --    No data found.  Updated Vital Signs BP 124/89 (BP Location: Right Arm)   Pulse 83   Temp 98.2 F (36.8 C) (Oral)   Resp 20   Ht 5' 5 (1.651 m)   Wt 278 lb (126.1 kg)   LMP 03/05/2024 (Exact Date)   SpO2 97%   BMI 46.26 kg/m   Visual Acuity Right Eye Distance:   Left Eye Distance:   Bilateral Distance:    Right Eye Near:   Left Eye Near:    Bilateral Near:     Physical Exam Vitals reviewed.  Constitutional:      General: She is awake.     Appearance: Normal appearance. She is well-developed and well-groomed.  HENT:     Head: Normocephalic and atraumatic.  Eyes:     General: Lids are normal. Gaze aligned appropriately.     Extraocular Movements: Extraocular movements intact.     Conjunctiva/sclera: Conjunctivae normal.  Pulmonary:     Effort: Pulmonary effort is normal.  Musculoskeletal:       Feet:  Neurological:     Mental Status: She is alert and oriented to person, place, and time.  Psychiatric:        Attention and Perception: Attention and perception normal.        Mood and Affect: Mood and affect normal.        Speech: Speech normal.        Behavior: Behavior normal. Behavior is cooperative.      UC Treatments / Results  Labs (all labs ordered are listed, but only abnormal results are displayed) Labs Reviewed - No data to display  EKG   Radiology No results found.  Procedures Incision and Drainage  Date/Time: 03/17/2024 3:07 PM  Performed by: Marylene Rocky BRAVO, PA-C Authorized by: Marylene Rocky BRAVO, PA-C   Consent:    Consent obtained:  Verbal   Consent given by:  Patient   Risks, benefits, and alternatives were discussed: yes      Risks discussed:  Bleeding, incomplete drainage, pain and infection   Alternatives discussed:  No treatment, alternative treatment and referral Universal protocol:    Procedure explained and questions answered to patient or proxy's satisfaction: yes     Patient identity confirmed:  Verbally with patient Location:    Type:  Abscess   Location:  Lower extremity   Lower extremity location:  Foot   Foot location:  L foot Pre-procedure details:    Skin preparation:  Povidone-iodine Sedation:    Sedation type:  None Anesthesia:    Anesthesia method:  Local infiltration   Local anesthetic:  Lidocaine  1% w/o epi Procedure type:    Complexity:  Simple Procedure details:    Incision types:  Single straight   Incision depth:  Dermal   Wound management:  Probed and deloculated   Drainage:  Bloody and purulent   Drainage amount:  Scant   Wound treatment:  Wound left open   Packing materials:  None Post-procedure details:    Procedure completion:  Tolerated well, no immediate complications Comments:     Approximately 1 cm long incision was made over the abscess area to allow for drainage of purulent material. Curved forceps were inserted into cavity to break up loculations and provide further drainage. Area was then covered with sterile gauze and Coban was wrapped around for further protection.   (including critical care time)  Medications Ordered in UC Medications - No data to display  Initial Impression / Assessment and Plan / UC Course  I have reviewed the triage vital signs and the nursing notes.  Pertinent labs & imaging results that were available during my care of the patient were reviewed by me and considered in my medical decision making (see chart for details).      Final Clinical Impressions(s) / UC Diagnoses   Final diagnoses:  Abscess of plantar aspect of foot    Patient presents today with concerns for left foot pain caused by an abscess on the sole of the foot.  She  was previously seen for this by podiatry on 01/12/2024 but missed her follow-up.  She was also seen for this in the ED on 01/09/2024.  I reviewed the imaging results from her visit on 01/09/2024 which do not show notable findings.  She reports that her symptoms resurged several days ago and have only been worsening.  Physical exam is notable for approximately 2 cm diameter fluctuant abscess on the sole of the foot.  Area is very tender to palpation.  I explained incision and drainage procedure and approach as well as potential complications and benefits.  Patient was allowed to ask questions and voiced agreement and understanding with the procedure and complications that could arise.  She is amenable to I&D procedure today.  Incision and drainage was performed as noted in procedure note above without complications.  Will send prescription for mupirocin  ointment to be applied to the area twice per day for 7 days to help prevent infection.  Home care measures were reviewed and provided in after visit summary.  ED and return precautions reviewed and provided in after visit summary.  Follow-up as needed for persistent or progressing symptoms.  Recommend close follow-up with podiatry to  ensure complete resolution.   Discharge Instructions      You were seen today for concerns of an abscess on your left foot. We performed an incision and drainage of the abscess to provide relief.  I recommend soaking your foot for 15-20 minutes one to two times per day to help encourage drainage and provide relief You can alternate Tylenol  and Ibuprofen  for pain management as needed. I am sending in a script for an antibiotic ointment called mupirocin  for you to apply to the area twice per day for 7 days to help prevent infection.  I recommend follow up with Podiatry for ongoing management and monitoring. If you notice the following please go to the ED: swelling of the foot, difficulty moving your foot, severe pain, copious amounts  of drainage or bleeding, numbness or tingling of the affected area or toes, fever and chills.       ED Prescriptions     Medication Sig Dispense Auth. Provider   mupirocin  ointment (BACTROBAN ) 2 % Apply 1 Application topically 2 (two) times daily for 7 days. 22 g Haley Fuerstenberg E, PA-C      PDMP not reviewed this encounter.   Bonnee Zertuche, Rocky BRAVO, PA-C 03/17/24 1531    Graycen Degan, Rocky BRAVO, PA-C 03/17/24 8467

## 2024-04-24 ENCOUNTER — Emergency Department (HOSPITAL_BASED_OUTPATIENT_CLINIC_OR_DEPARTMENT_OTHER)
Admission: EM | Admit: 2024-04-24 | Discharge: 2024-04-24 | Disposition: A | Discharge status: Home / Self Care | Attending: Emergency Medicine | Admitting: Emergency Medicine

## 2024-04-24 ENCOUNTER — Other Ambulatory Visit: Payer: Self-pay

## 2024-04-24 ENCOUNTER — Emergency Department (HOSPITAL_BASED_OUTPATIENT_CLINIC_OR_DEPARTMENT_OTHER)

## 2024-04-24 ENCOUNTER — Encounter (HOSPITAL_BASED_OUTPATIENT_CLINIC_OR_DEPARTMENT_OTHER): Payer: Self-pay

## 2024-04-24 DIAGNOSIS — R519 Headache, unspecified: Secondary | ICD-10-CM | POA: Insufficient documentation

## 2024-04-24 DIAGNOSIS — S299XXA Unspecified injury of thorax, initial encounter: Secondary | ICD-10-CM | POA: Diagnosis present

## 2024-04-24 DIAGNOSIS — S20219A Contusion of unspecified front wall of thorax, initial encounter: Secondary | ICD-10-CM | POA: Insufficient documentation

## 2024-04-24 DIAGNOSIS — Y92410 Unspecified street and highway as the place of occurrence of the external cause: Secondary | ICD-10-CM | POA: Diagnosis not present

## 2024-04-24 LAB — BASIC METABOLIC PANEL WITH GFR
Anion gap: 10 (ref 5–15)
BUN: 11 mg/dL (ref 6–20)
CO2: 24 mmol/L (ref 22–32)
Calcium: 9 mg/dL (ref 8.9–10.3)
Chloride: 102 mmol/L (ref 98–111)
Creatinine, Ser: 0.68 mg/dL (ref 0.44–1.00)
GFR, Estimated: >60 mL/min (ref >60)
Glucose, Bld: 234 mg/dL — ABNORMAL HIGH (ref 70–99)
Potassium: 3.7 mmol/L (ref 3.5–5.1)
Sodium: 136 mmol/L (ref 135–145)

## 2024-04-24 LAB — CBC
HCT: 36.7 % (ref 36.0–46.0)
Hemoglobin: 11.9 g/dL — ABNORMAL LOW (ref 12.0–15.0)
MCH: 28.5 pg (ref 26.0–34.0)
MCHC: 32.4 g/dL (ref 30.0–36.0)
MCV: 87.8 fL (ref 80.0–100.0)
Platelets: 261 K/uL (ref 150–400)
RBC: 4.18 MIL/uL (ref 3.87–5.11)
RDW: 14 % (ref 11.5–15.5)
WBC: 5.5 K/uL (ref 4.0–10.5)
nRBC: 0 % (ref 0.0–0.2)

## 2024-04-24 LAB — PREGNANCY, URINE: Preg Test, Ur: NEGATIVE

## 2024-04-24 LAB — TROPONIN T, HIGH SENSITIVITY
Troponin T High Sensitivity: <15 ng/L (ref 0–19)
Troponin T High Sensitivity: <15 ng/L (ref 0–19)

## 2024-04-24 MED ORDER — IOHEXOL 300 MG/ML  SOLN
100.0000 mL | Freq: Once | INTRAMUSCULAR | Status: AC | PRN
  Administered 2024-04-24: 100 mL via INTRAVENOUS

## 2024-04-24 MED ORDER — KETOROLAC TROMETHAMINE 15 MG/ML IJ SOLN
15.0000 mg | Freq: Once | INTRAMUSCULAR | Status: AC
  Administered 2024-04-24: 15 mg via INTRAVENOUS
  Filled 2024-04-24: qty 1

## 2024-04-24 MED ORDER — NAPROXEN 500 MG PO TABS
500.0000 mg | ORAL_TABLET | Freq: Two times a day (BID) | ORAL | 0 refills | Status: AC
Start: 2024-04-24 — End: ?

## 2024-04-24 MED ORDER — METHOCARBAMOL 500 MG PO TABS: 1000.0000 mg | ORAL_TABLET | Freq: Three times a day (TID) | ORAL | 0 refills | Status: AC | PRN
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
## 2024-05-21 ENCOUNTER — Other Ambulatory Visit: Payer: Self-pay | Admitting: Obstetrics & Gynecology

## 2024-05-21 DIAGNOSIS — Z1231 Encounter for screening mammogram for malignant neoplasm of breast: Secondary | ICD-10-CM

## 2024-06-11 ENCOUNTER — Ambulatory Visit
Admission: RE | Admit: 2024-06-11 | Discharge: 2024-06-11 | Disposition: A | Discharge status: Home / Self Care | Source: Ambulatory Visit | Attending: Obstetrics & Gynecology | Admitting: Obstetrics & Gynecology

## 2024-06-11 DIAGNOSIS — Z1231 Encounter for screening mammogram for malignant neoplasm of breast: Secondary | ICD-10-CM

## 2024-06-14 ENCOUNTER — Other Ambulatory Visit: Payer: Self-pay | Admitting: Medical Genetics
